# Patient Record
Sex: Female | Born: 1985 | Race: White | Hispanic: No | Marital: Married | Smoking: Current every day smoker
Health system: Southern US, Community
[De-identification: ages and names within clinical notes are randomized; demographics above are authoritative.]

## PROBLEM LIST (undated history)

## (undated) DIAGNOSIS — E282 Polycystic ovarian syndrome: Secondary | ICD-10-CM

## (undated) DIAGNOSIS — F419 Anxiety disorder, unspecified: Secondary | ICD-10-CM

## (undated) DIAGNOSIS — K589 Irritable bowel syndrome without diarrhea: Secondary | ICD-10-CM

## (undated) DIAGNOSIS — E119 Type 2 diabetes mellitus without complications: Secondary | ICD-10-CM

## (undated) DIAGNOSIS — K219 Gastro-esophageal reflux disease without esophagitis: Secondary | ICD-10-CM

## (undated) DIAGNOSIS — O24419 Gestational diabetes mellitus in pregnancy, unspecified control: Secondary | ICD-10-CM

## (undated) DIAGNOSIS — N912 Amenorrhea, unspecified: Secondary | ICD-10-CM

## (undated) DIAGNOSIS — E785 Hyperlipidemia, unspecified: Secondary | ICD-10-CM

## (undated) DIAGNOSIS — E079 Disorder of thyroid, unspecified: Secondary | ICD-10-CM

## (undated) DIAGNOSIS — K5909 Other constipation: Secondary | ICD-10-CM

## (undated) HISTORY — DX: Gestational diabetes mellitus in pregnancy, unspecified control: O24.419

## (undated) HISTORY — DX: Anxiety disorder, unspecified: F41.9

## (undated) HISTORY — DX: Type 2 diabetes mellitus without complications: E11.9

## (undated) HISTORY — DX: Polycystic ovarian syndrome: E28.2

## (undated) HISTORY — PX: OTHER SURGICAL HISTORY: SHX169

## (undated) HISTORY — DX: Other constipation: K59.09

## (undated) HISTORY — DX: Amenorrhea, unspecified: N91.2

## (undated) HISTORY — PX: CORONARY ANGIOPLASTY WITH STENT PLACEMENT: SHX49

## (undated) HISTORY — DX: Hyperlipidemia, unspecified: E78.5

## (undated) HISTORY — DX: Irritable bowel syndrome, unspecified: K58.9

---

## 2007-07-14 ENCOUNTER — Ambulatory Visit: Payer: Self-pay | Admitting: Unknown Physician Specialty

## 2013-11-16 DIAGNOSIS — K5904 Chronic idiopathic constipation: Secondary | ICD-10-CM | POA: Insufficient documentation

## 2013-11-16 DIAGNOSIS — K589 Irritable bowel syndrome without diarrhea: Secondary | ICD-10-CM | POA: Insufficient documentation

## 2015-01-20 ENCOUNTER — Encounter: Payer: Self-pay | Admitting: Gynecology

## 2015-01-20 ENCOUNTER — Ambulatory Visit
Admission: EM | Admit: 2015-01-20 | Discharge: 2015-01-20 | Disposition: A | Discharge status: Home / Self Care | Payer: BLUE CROSS/BLUE SHIELD | Attending: Family Medicine | Admitting: Family Medicine

## 2015-01-20 DIAGNOSIS — J029 Acute pharyngitis, unspecified: Secondary | ICD-10-CM | POA: Insufficient documentation

## 2015-01-20 HISTORY — DX: Disorder of thyroid, unspecified: E07.9

## 2015-01-20 HISTORY — DX: Gastro-esophageal reflux disease without esophagitis: K21.9

## 2015-01-20 LAB — RAPID STREP SCREEN (MED CTR MEBANE ONLY): STREPTOCOCCUS, GROUP A SCREEN (DIRECT): NEGATIVE

## 2015-01-20 MED ORDER — AMOXICILLIN-POT CLAVULANATE 875-125 MG PO TABS: 1.0000 | ORAL_TABLET | Freq: Two times a day (BID) | ORAL | Status: AC

## 2015-01-20 NOTE — ED Provider Notes (Signed)
SUBJECTIVE:  Holly Villa is a 29 y.o. female who complains of sore throat, nasal drainage, right ear pain, fever for the last 3 days. Denies CP, SOB, N/V/D, abdominal pain, severe headache. Has tried OTC Medication without much relief. Admits to hx of mono in the past.   ASN:[id number] except mentioned above.    OBJECTIVE: She appears well, vital signs are as noted.  General: NAD HEENT: moderate pharyngeal erythema with tonsillar enlargement, mild exudate, no abscess appreciated, no erythema of TMs, mild cervical LAD bilaterally Respiratory: CTA B Cardiology: RRR Abdomen: +BS, NT/ND, no guarding or rebound Neurological: CN II-XII grossly intact   ASSESSMENT:  Pharyngitis  PLAN: Rapid Strep negative. Throat cx sent. Augmentin, Claritin prn, Delysm prn, Tylenol/Motrin prn, rest hydration, seek medical attention if symptoms persist or worsen.        Jolene Provost, MD 01/20/15 1021

## 2015-01-20 NOTE — ED Notes (Signed)
Patient c/o sore throat / ear pain. Pt. Stated painful to swallow and ears hurt when swallowing x 3 days. Pt. Stated fever at home of 102.

## 2015-01-22 ENCOUNTER — Telehealth: Payer: Self-pay | Admitting: Emergency Medicine

## 2015-01-22 LAB — CULTURE, GROUP A STREP (THRC)

## 2015-01-22 NOTE — ED Notes (Signed)
Patient was notified that her Strep test was positive and to continue with her Augmentin.  Patient was also instructed to follow-up here or with her PCP if her symptoms no not improve once she has completed her antibiotic.  Patient verbalized understanding.

## 2016-08-31 NOTE — L&D Delivery Note (Signed)
       Delivery Note   Sameera Sprunk is a 31 y.o. G1P0 at [redacted]w[redacted]d Estimated Date of Delivery: 04/26/17  PRE-OPERATIVE DIAGNOSIS:  1) [redacted]w[redacted]d pregnancy.  2) Labor 3) Non-reassuring BPP 4) hydramnios  POST-OPERATIVE DIAGNOSIS:  1) [redacted]w[redacted]d pregnancy s/p Vaginal, Spontaneous Delivery   Delivery Type: Vaginal, Spontaneous Delivery    Delivery Clinician: Linzie Collin   Delivery Anesthesia: Local   Labor Complications:     Additional complications:   ESTIMATED BLOOD LOSS:    FINDINGS:   1) female infant, Apgar scores of 7    at 1 minute and 8    at 5 minutes and a birthweight of 107.94  ounces.    2) Nuchal cord: No  SPECIMENS:   PLACENTA:   Appearance: Intact    Removal: Spontaneous      Disposition:     DISPOSITION:  Infant to left in stable condition in the delivery room, with L&D personnel and mother,  NARRATIVE SUMMARY: Labor course:  Ms. Diego Chahal is a G1P0 at [redacted]w[redacted]d who presented for labor management.  She progressed well in labor without pitocin.  She received the appropriate anesthesia and proceeded to complete dilation. She evidenced good maternal expulsive effort during the second stage. She went on to deliver a viable infant. The placenta delivered without problems and was noted to be complete. A perineal and vaginal examination was performed. Episiotomy/Lacerations: - Bilat superficial labial lacs Episiotomy or lacerations were repaired with Vicryl suture using local anesthesia. The patient tolerated this well.  Elonda Husky, M.D. 03/26/2017 2:15 PM

## 2016-10-05 ENCOUNTER — Ambulatory Visit (INDEPENDENT_AMBULATORY_CARE_PROVIDER_SITE_OTHER): Payer: BLUE CROSS/BLUE SHIELD | Admitting: Certified Nurse Midwife

## 2016-10-05 VITALS — BP 122/92 | HR 95 | Ht 62.5 in | Wt 149.3 lb

## 2016-10-05 DIAGNOSIS — Z1389 Encounter for screening for other disorder: Secondary | ICD-10-CM

## 2016-10-05 DIAGNOSIS — Z113 Encounter for screening for infections with a predominantly sexual mode of transmission: Secondary | ICD-10-CM

## 2016-10-05 DIAGNOSIS — N926 Irregular menstruation, unspecified: Secondary | ICD-10-CM

## 2016-10-05 DIAGNOSIS — E079 Disorder of thyroid, unspecified: Secondary | ICD-10-CM

## 2016-10-05 DIAGNOSIS — Z3687 Encounter for antenatal screening for uncertain dates: Secondary | ICD-10-CM

## 2016-10-05 DIAGNOSIS — Z3401 Encounter for supervision of normal first pregnancy, first trimester: Secondary | ICD-10-CM

## 2016-10-05 DIAGNOSIS — T7589XA Other specified effects of external causes, initial encounter: Secondary | ICD-10-CM

## 2016-10-05 DIAGNOSIS — E282 Polycystic ovarian syndrome: Secondary | ICD-10-CM

## 2016-10-05 NOTE — Patient Instructions (Signed)
Pregnancy and Zika Virus Disease Introduction Zika virus disease, or Zika, is an illness that can spread to people from mosquitoes that carry the virus. It may also spread from person to person through infected body fluids. Zika first occurred in [place], but recently it has spread to new areas. The virus occurs in tropical climates. The location of Zika continues to change. Most people who become infected with Zika virus do not develop serious illness. However, Zika may cause birth defects in an unborn baby whose mother is infected with the virus. It may also increase the risk of miscarriage. What are the symptoms of Zika virus disease? In many cases, people who have been infected with Zika virus do not develop any symptoms. If symptoms appear, they usually start about a week after the person is infected. Symptoms are usually mild. They may include:  Fever.  Rash.  Red eyes.  Joint pain. How does Zika virus disease spread? The main way that Zika virus spreads is through the bite of a certain type of mosquito. Unlike most types of mosquitos, which bite only at night, the type of mosquito that carries Zika virus bites both at night and during the day. Zika virus can also spread through sexual contact, through a blood transfusion, and from a mother to her baby before or during birth. Once you have had Zika virus disease, it is unlikely that you will get it again. Can I pass Zika to my baby during pregnancy? Yes, Zika can pass from a mother to her baby before or during birth. What problems can Zika cause for my baby? A woman who is infected with Zika virus while pregnant is at risk of having her baby born with a condition in which the brain or head is smaller than expected (microcephaly). Babies who have microcephaly can have developmental delays, seizures, hearing problems, and vision problems. Having Zika virus disease during pregnancy can also increase the risk of miscarriage. How can Zika  virus disease be prevented? There is no vaccine to prevent Zika. The best way to prevent the disease is to avoid infected mosquitoes and avoid exposure to body fluids that can spread the virus. Avoid any possible exposure to Zika by taking the following precautions. For women and their sex partners:  Avoid traveling to high-risk areas. The locations where Zika is being reported change often. To identify high-risk areas, check the CDC travel website: www.cdc.gov/zika/geo/index.html  If you or your sex partner must travel to a high-risk area, talk with a health care provider before and after traveling.  Take all precautions to avoid mosquito bites if you live in, or travel to, any of the high-risk areas. Insect repellents are safe to use during pregnancy.  Ask your health care provider when it is safe to have sexual contact. For women:  If you are pregnant or trying to become pregnant, avoid sexual contact with persons who may have been exposed to Zika virus, persons who have possible symptoms of Zika, or persons whose history you are unsure about. If you choose to have sexual contact with someone who may have been exposed to Zika virus, use condoms correctly during the entire duration of sexual activity, every time. Do not share sexual devices, as you may be exposed to body fluids.  Ask your health care provider about when it is safe to attempt pregnancy after a possible exposure to Zika virus. What steps should I take to avoid mosquito bites? Take these steps to avoid mosquito bites when you   are in a high-risk area:  Wear loose clothing that covers your arms and legs.  Limit your outdoor activities.  Do not open windows unless they have window screens.  Sleep under mosquito nets.  Use insect repellent. The best insect repellents have:  DEET, picaridin, oil of lemon eucalyptus (OLE), or [id number] in them.  Higher amounts of an active ingredient in them.  Remember that insect repellents  are safe to use during pregnancy.  Do not use OLE on children who are younger than 3 years of age. Do not use insect repellent on babies who are younger than 2 months of age.  Cover your child's stroller with mosquito netting. Make sure the netting fits snugly and that any loose netting does not cover your child's mouth or nose. Do not use a blanket as a mosquito-protection cover.  Do not apply insect repellent underneath clothing.  If you are using sunscreen, apply the sunscreen before applying the insect repellent.  Treat clothing with permethrin. Do not apply permethrin directly to your skin. Follow label directions for safe use.  Get rid of standing water, where mosquitoes may reproduce. Standing water is often found in items such as buckets, bowls, animal food dishes, and flowerpots. When you return from traveling to any high-risk area, continue taking actions to protect yourself against mosquito bites for 3 weeks, even if you show no signs of illness. This will prevent spreading Zika virus to uninfected mosquitoes. What should I know about the sexual transmission of Zika? People can spread Zika to their sexual partners during vaginal, anal, or oral sex, or by sharing sexual devices. Many people with Zika do not develop symptoms, so a person could spread the disease without knowing that they are infected. The greatest risk is to women who are pregnant or who may become pregnant. Zika virus can live longer in semen than it can live in blood. Couples can prevent sexual transmission of the virus by:  Using condoms correctly during the entire duration of sexual activity, every time. This includes vaginal, anal, and oral sex.  Not sharing sexual devices. Sharing increases your risk of being exposed to body fluid from another person.  Avoiding all sexual activity until your health care provider says it is safe. Should I be tested for Zika virus? A sample of your blood can be tested for Zika  virus. A pregnant woman should be tested if she may have been exposed to the virus or if she has symptoms of Zika. She may also have additional tests done during her pregnancy, such ultrasound testing. Talk with your health care provider about which tests are recommended. This information is not intended to replace advice given to you by your health care provider. Make sure you discuss any questions you have with your health care provider. Document Released: 05/08/2015 Document Revised: 01/23/2016 Document Reviewed: 05/01/2015  2017 Elsevier Pregnancy and Toxoplasmosis Toxoplasmosis is an infection that is caused by a parasite. Usually, there are no symptoms and the body can fight off the infection. If you get toxoplasmosis during pregnancy, there is a chance that the infection will spread to your baby. If this happens, your baby may develop serious health problems, such as blindness, intellectual disabilities, and other neurological disorders. Some of these problems may not show up for years. How do people get toxoplasmosis? You can get toxoplasmosis if:  You touch anything that is contaminated with infected cat feces and then you touch your mouth.  You eat raw or undercooked meat from   an infected animal.  You eat fruits and vegetables that were grown in contaminated soil. Your baby can get toxoplasmosis through your blood supply if you are infected during pregnancy or just before pregnancy. How can I protect myself and my baby against toxoplasmosis?  Do not get a new cat.  Do not touch stray cats.  If you have a sandbox, cover it when it is not being used.  Avoid working in soil where cats may leave feces.  Wear gloves when you work in the soil. Wash your hands with soap and water when you are finished.  If you have a cat:  Have someone else change the cat's litter box daily. He or she should wash his or her hands afterward.  Do not let your cat outside.  Do not feed your cat any  raw meat.  Do not eat undercooked meat, especially meat that has never been frozen.  Wash and peel all fruits and vegetables before eating them.  Avoid drinking untreated water.  If you have toxoplasmosis and you are not pregnant, wait at least 6 months before becoming pregnant. How do I know if I have toxoplasmosis? The only way to know for sure that you have toxoplasmosis is with a test. People with toxoplasmosis do not always have symptoms. If symptoms are present, they may include:  A fever.  Swollen glands.  Muscle aches.  Headaches.  Feeling like you have a cold or the flu. If you think you have toxoplasmosis, or if you think that you may have been exposed to it, call your health care provider. How is toxoplasmosis diagnosed? When you become pregnant, your health care provider may order a blood test to check whether you have ever had toxoplasmosis.  If you have had toxoplasmosis infection before, you cannot get it again.  If you have never had toxoplasmosis, your health care provider may repeat this test at a later date.  If you become infected during pregnancy, your health care provider may do more tests to find out whether the infection has spread to the baby.  Other tests may include an ultrasound and a test of your amniotic fluid (amniocentesis). How is toxoplasmosis treated? Toxoplasmosis may be treated with antibiotics and other medicines.  Some of these medicines can lower your baby's chance of developing complications later on.  Medicines may need to be taken for up to one year. What should I do at home if I am diagnosed with toxoplasmosis?  Take over-the-counter and prescription medicines only as told by your health care provider.  If you were prescribed an antibiotic medicine, take it as told by your health care provider. Do not stop taking the antibiotic even if you start to feel better.  Keep all follow-up visits as told by your health care provider. This  is important. This information is not intended to replace advice given to you by your health care provider. Make sure you discuss any questions you have with your health care provider. Document Released: 11/23/2000 Document Revised: 04/16/2016 Document Reviewed: 03/22/2016 Elsevier Interactive Patient Education  2017 [employer] Minor Illnesses and Medications in Pregnancy  Cold/Flu:  Sudafed for congestion- Robitussin (plain) for cough- Tylenol for discomfort.  Please follow the directions on the label.  Try not to take any more than needed.  OTC Saline nasal spray and air humidifier or cool-mist  Vaporizer to sooth nasal irritation and to loosen congestion.  It is also important to increase intake of non carbonated fluids, especially if you   have a fever.  Constipation:  Colace-2 capsules at bedtime; Metamucil- follow directions on label; Senokot- 1 tablet at bedtime.  Any one of these medications can be used.  It is also very important to increase fluids and fruits along with regular exercise.  If problem persists please call the office.  Diarrhea:  Kaopectate as directed on the label.  Eat a bland diet and increase fluids.  Avoid highly seasoned foods.  Headache:  Tylenol 1 or 2 tablets every 3-4 hours as needed  Indigestion:  Maalox, Mylanta, Tums or Rolaids- as directed on label.  Also try to eat small meals and avoid fatty, greasy or spicy foods.  Nausea with or without Vomiting:  Nausea in pregnancy is caused by increased levels of hormones in the body which influence the digestive system and cause irritation when stomach acids accumulate.  Symptoms usually subside after 1st trimester of pregnancy.  Try the following: 1. Keep saltines, graham crackers or dry toast by your bed to eat upon awakening. 2. Don't let your stomach get empty.  Try to eat 5-6 small meals per day instead of 3 large ones. 3. Avoid greasy fatty or highly seasoned foods.  4. Take OTC Unisom 1 tablet at bed time  along with OTC Vitamin B6 25-50 mg 3 times per day.    If nausea continues with vomiting and you are unable to keep down food and fluids you may need a prescription medication.  Please notify your provider.   Sore throat:  Chloraseptic spray, throat lozenges and or plain Tylenol.  Vaginal Yeast Infection:  OTC Monistat for 7 days as directed on label.  If symptoms do not resolve within a week notify provider.  If any of the above problems do not subside with recommended treatment please call the office for further assistance.   Do not take Aspirin, Advil, Motrin or Ibuprofen.  * * OTC= Over the counter Hyperemesis Gravidarum Hyperemesis gravidarum is a severe form of nausea and vomiting that happens during pregnancy. Hyperemesis is worse than morning sickness. It may cause you to have nausea or vomiting all day for many days. It may keep you from eating and drinking enough food and liquids. Hyperemesis usually occurs during the first half (the first 20 weeks) of pregnancy. It often goes away once a woman is in her second half of pregnancy. However, sometimes hyperemesis continues through an entire pregnancy. What are the causes? The cause of this condition is not known. It may be related to changes in chemicals (hormones) in the body during pregnancy, such as the high level of pregnancy hormone (human chorionic gonadotropin) or the increase in the female sex hormone (estrogen). What are the signs or symptoms? Symptoms of this condition include:  Severe nausea and vomiting.  Nausea that does not go away.  Vomiting that does not allow you to keep any food down.  Weight loss.  Body fluid loss (dehydration).  Having no desire to eat, or not liking food that you have previously enjoyed. How is this diagnosed? This condition may be diagnosed based on:  A physical exam.  Your medical history.  Your symptoms.  Blood tests.  Urine tests. How is this treated? This condition may be  managed with medicine. If medicines to do not help relieve nausea and vomiting, you may need to receive fluids through an IV tube at the hospital. Follow these instructions at home:  Take over-the-counter and prescription medicines only as told by your health care provider.  Avoid iron   pills and multivitamins that contain iron for the first 3-4 months of pregnancy. If you take prescription iron pills, do not stop taking them unless your health care provider approves.  Take the following actions to help prevent nausea and vomiting:  In the morning, before getting out of bed, try eating a couple of dry crackers or a piece of toast.  Avoid foods and smells that upset your stomach. Fatty and spicy foods may make nausea worse.  Eat 5-6 small meals a day.  Do not drink fluids while eating meals. Drink between meals.  Eat or suck on things that have ginger in them. Ginger can help relieve nausea.  Avoid food preparation. The smell of food can spoil your appetite or trigger nausea.  Follow instructions from your health care provider about eating or drinking restrictions.  For snacks, eat high-protein foods, such as cheese.  Keep all follow-up and pre-birth (prenatal) visits as told by your health care provider. This is important. Contact a health care provider if:  You have pain in your abdomen.  You have a severe headache.  You have vision problems.  You are losing weight. Get help right away if:  You cannot drink fluids without vomiting.  You vomit blood.  You have constant nausea and vomiting.  You are very weak.  You are very thirsty.  You feel dizzy.  You faint.  You have a fever or other symptoms that last for more than 2-3 days.  You have a fever and your symptoms suddenly get worse. Summary  Hyperemesis gravidarum is a severe form of nausea and vomiting that happens during pregnancy.  Making some changes to your eating habits may help relieve nausea and  vomiting.  This condition may be managed with medicine.  If medicines to do not help relieve nausea and vomiting, you may need to receive fluids through an IV tube at the hospital. This information is not intended to replace advice given to you by your health care provider. Make sure you discuss any questions you have with your health care provider. Document Released: 08/17/2005 Document Revised: 04/15/2016 Document Reviewed: 04/15/2016 Elsevier Interactive Patient Education  2017 [employer] First Trimester of Pregnancy The first trimester of pregnancy is from week 1 until the end of week 12 (months 1 through 3). During this time, your baby will begin to develop inside you. At 6-8 weeks, the eyes and face are formed, and the heartbeat can be seen on ultrasound. At the end of 12 weeks, all the baby's organs are formed. Prenatal care is all the medical care you receive before the birth of your baby. Make sure you get good prenatal care and follow all of your doctor's instructions. Follow these instructions at home: Medicines  Take medicine only as told by your doctor. Some medicines are safe and some are not during pregnancy.  Take your prenatal vitamins as told by your doctor.  Take medicine that helps you poop (stool softener) as needed if your doctor says it is okay. Diet  Eat regular, healthy meals.  Your doctor will tell you the amount of weight gain that is right for you.  Avoid raw meat and uncooked cheese.  If you feel sick to your stomach (nauseous) or throw up (vomit):  Eat 4 or 5 small meals a day instead of 3 large meals.  Try eating a few soda crackers.  Drink liquids between meals instead of during meals.  If you have a hard time pooping (constipation):  Eat   high-fiber foods like fresh vegetables, fruit, and whole grains.  Drink enough fluids to keep your pee (urine) clear or pale yellow. Activity and Exercise  Exercise only as told by your doctor. Stop  exercising if you have cramps or pain in your lower belly (abdomen) or low back.  Try to avoid standing for long periods of time. Move your legs often if you must stand in one place for a long time.  Avoid heavy lifting.  Wear low-heeled shoes. Sit and stand up straight.  You can have sex unless your doctor tells you not to. Relief of Pain or Discomfort  Wear a good support bra if your breasts are sore.  Take warm water baths (sitz baths) to soothe pain or discomfort caused by hemorrhoids. Use hemorrhoid cream if your doctor says it is okay.  Rest with your legs raised if you have leg cramps or low back pain.  Wear support hose if you have puffy, bulging veins (varicose veins) in your legs. Raise (elevate) your feet for 15 minutes, 3-4 times a day. Limit salt in your diet. Prenatal Care  Schedule your prenatal visits by the twelfth week of pregnancy.  Write down your questions. Take them to your prenatal visits.  Keep all your prenatal visits as told by your doctor. Safety  Wear your seat belt at all times when driving.  Make a list of emergency phone numbers. The list should include numbers for family, friends, the hospital, and police and fire departments. General Tips  Ask your doctor for a referral to a local prenatal class. Begin classes no later than at the start of month 6 of your pregnancy.  Ask for help if you need counseling or help with nutrition. Your doctor can give you advice or tell you where to go for help.  Do not use hot tubs, steam rooms, or saunas.  Do not douche or use tampons or scented sanitary pads.  Do not cross your legs for long periods of time.  Avoid litter boxes and soil used by cats.  Avoid all smoking, herbs, and alcohol. Avoid drugs not approved by your doctor.  Do not use any tobacco products, including cigarettes, chewing tobacco, and electronic cigarettes. If you need help quitting, ask your doctor. You may get counseling or other  support to help you quit.  Visit your dentist. At home, brush your teeth with a soft toothbrush. Be gentle when you floss. Get help if:  You are dizzy.  You have mild cramps or pressure in your lower belly.  You have a nagging pain in your belly area.  You continue to feel sick to your stomach, throw up, or have watery poop (diarrhea).  You have a bad smelling fluid coming from your vagina.  You have pain with peeing (urination).  You have increased puffiness (swelling) in your face, hands, legs, or ankles. Get help right away if:  You have a fever.  You are leaking fluid from your vagina.  You have spotting or bleeding from your vagina.  You have very bad belly cramping or pain.  You gain or lose weight rapidly.  You throw up blood. It may look like coffee grounds.  You are around people who have German measles, fifth disease, or chickenpox.  You have a very bad headache.  You have shortness of breath.  You have any kind of trauma, such as from a fall or a car accident. This information is not intended to replace advice given to you by   your health care provider. Make sure you discuss any questions you have with your health care provider. Document Released: 02/03/2008 Document Revised: 01/23/2016 Document Reviewed: 06/27/2013 Elsevier Interactive Patient Education  2017 [employer] Commonly Asked Questions During Pregnancy  Cats: A parasite can be excreted in cat feces.  To avoid exposure you need to have another person empty the little box.  If you must empty the litter box you will need to wear gloves.  Wash your hands after handling your cat.  This parasite can also be found in raw or undercooked meat so this should also be avoided.  Colds, Sore Throats, Flu: Please check your medication sheet to see what you can take for symptoms.  If your symptoms are unrelieved by these medications please call the office.  Dental Work: Most any dental work your dentist  recommends is permitted.  X-rays should only be taken during the first trimester if absolutely necessary.  Your abdomen should be shielded with a lead apron during all x-rays.  Please notify your provider prior to receiving any x-rays.  Novocaine is fine; gas is not recommended.  If your dentist requires a note from us prior to dental work please call the office and we will provide one for you.  Exercise: Exercise is an important part of staying healthy during your pregnancy.  You may continue most exercises you were accustomed to prior to pregnancy.  Later in your pregnancy you will most likely notice you have difficulty with activities requiring balance like riding a bicycle.  It is important that you listen to your body and avoid activities that put you at a higher risk of falling.  Adequate rest and staying well hydrated are a must!  If you have questions about the safety of specific activities ask your provider.    Exposure to Children with illness: Try to avoid obvious exposure; report any symptoms to us when noted,  If you have chicken pos, red measles or mumps, you should be immune to these diseases.   Please do not take any vaccines while pregnant unless you have checked with your OB provider.  Fetal Movement: After 28 weeks we recommend you do "kick counts" twice daily.  Lie or sit down in a calm quiet environment and count your baby movements "kicks".  You should feel your baby at least 10 times per hour.  If you have not felt 10 kicks within the first hour get up, walk around and have something sweet to eat or drink then repeat for an additional hour.  If count remains less than 10 per hour notify your provider.  Fumigating: Follow your pest control agent's advice as to how long to stay out of your home.  Ventilate the area well before re-entering.  Hemorrhoids:   Most over-the-counter preparations can be used during pregnancy.  Check your medication to see what is safe to use.  It is important  to use a stool softener or fiber in your diet and to drink lots of liquids.  If hemorrhoids seem to be getting worse please call the office.   Hot Tubs:  Hot tubs Jacuzzis and saunas are not recommended while pregnant.  These increase your internal body temperature and should be avoided.  Intercourse:  Sexual intercourse is safe during pregnancy as long as you are comfortable, unless otherwise advised by your provider.  Spotting may occur after intercourse; report any bright red bleeding that is heavier than spotting.  Labor:  If you know that you are   in labor, please go to the hospital.  If you are unsure, please call the office and let us help you decide what to do.  Lifting, straining, etc:  If your job requires heavy lifting or straining please check with your provider for any limitations.  Generally, you should not lift items heavier than that you can lift simply with your hands and arms (no back muscles)  Painting:  Paint fumes do not harm your pregnancy, but may make you ill and should be avoided if possible.  Latex or water based paints have less odor than oils.  Use adequate ventilation while painting.  Permanents & Hair Color:  Chemicals in hair dyes are not recommended as they cause increase hair dryness which can increase hair loss during pregnancy.  " Highlighting" and permanents are allowed.  Dye may be absorbed differently and permanents may not hold as well during pregnancy.  Sunbathing:  Use a sunscreen, as skin burns easily during pregnancy.  Drink plenty of fluids; avoid over heating.  Tanning Beds:  Because their possible side effects are still unknown, tanning beds are not recommended.  Ultrasound Scans:  Routine ultrasounds are performed at approximately 20 weeks.  You will be able to see your baby's general anatomy an if you would like to know the gender this can usually be determined as well.  If it is questionable when you conceived you may also receive an ultrasound early  in your pregnancy for dating purposes.  Otherwise ultrasound exams are not routinely performed unless there is a medical necessity.  Although you can request a scan we ask that you pay for it when conducted because insurance does not cover " patient request" scans.  Work: If your pregnancy proceeds without complications you may work until your due date, unless your physician or employer advises otherwise.  Round Ligament Pain/Pelvic Discomfort:  Sharp, shooting pains not associated with bleeding are fairly common, usually occurring in the second trimester of pregnancy.  They tend to be worse when standing up or when you remain standing for long periods of time.  These are the result of pressure of certain pelvic ligaments called "round ligaments".  Rest, Tylenol and heat seem to be the most effective relief.  As the womb and fetus grow, they rise out of the pelvis and the discomfort improves.  Please notify the office if your pain seems different than that described.  It may represent a more serious condition.   

## 2016-10-05 NOTE — Progress Notes (Signed)
Holly Villa presents for NOB nurse interview visit. Pregnancy confirmation done by [facility]. Pt brought her MyChart results which was a BHCG: 96,472. Will repeat BHCG.  She also states that UPT: positive also. No paper work for that. Pt did not know her LMP. She may have 6 months of no menses at times. Pt states she was just told she had PCOS and was to get an IUD.  Her best guess was 05/31/2016. This makes her EGA 18.1 wks. Pt does not appear to be showing at this time. Pt will have ultrasound to determine viability and dating asap.   G-1. P-0000. Pregnancy education material explained and given. Has cat in the home. She has changed the litter box most likely before she knew she was pregnant so a Toxoplasmosis was done. NOB labs ordered.  HIV labs and Drug screen were explained optional and she did not decline. Drug screen ordered. PNV encouraged. Genetic screening declined, to discuss with provider. Pt. To follow up with provider after ultrasound for dating and viability, for NOB physical. Pt also states she is not taking her synthroid because it did not make her feel any better and her labs were normal. On 07/09/2016 pt had TSH: 3.35 and T4: 0.79 at [facility]. Will check today other blood work.  All questions answered.

## 2016-10-06 ENCOUNTER — Ambulatory Visit (INDEPENDENT_AMBULATORY_CARE_PROVIDER_SITE_OTHER): Payer: BLUE CROSS/BLUE SHIELD

## 2016-10-06 DIAGNOSIS — N926 Irregular menstruation, unspecified: Secondary | ICD-10-CM | POA: Diagnosis not present

## 2016-10-06 DIAGNOSIS — Z3401 Encounter for supervision of normal first pregnancy, first trimester: Secondary | ICD-10-CM | POA: Diagnosis not present

## 2016-10-06 DIAGNOSIS — Z3687 Encounter for antenatal screening for uncertain dates: Secondary | ICD-10-CM

## 2016-10-06 LAB — CBC WITH DIFFERENTIAL/PLATELET
Basophils Absolute: 0.1 10*3/uL (ref 0.0–0.2)
Basos: 1 %
EOS (ABSOLUTE): 0.3 10*3/uL (ref 0.0–0.4)
Eos: 3 %
Hematocrit: 41.2 % (ref 34.0–46.6)
Hemoglobin: 14.4 g/dL (ref 11.1–15.9)
Immature Grans (Abs): 0 10*3/uL (ref 0.0–0.1)
Immature Granulocytes: 0 %
LYMPHS ABS: 3.8 10*3/uL — AB (ref 0.7–3.1)
Lymphs: 29 %
MCH: 30.6 pg (ref 26.6–33.0)
MCHC: 35 g/dL (ref 31.5–35.7)
MCV: 88 fL (ref 79–97)
MONOS ABS: 0.7 10*3/uL (ref 0.1–0.9)
Monocytes: 6 %
NEUTROS PCT: 61 %
Neutrophils Absolute: 8 10*3/uL — ABNORMAL HIGH (ref 1.4–7.0)
Platelets: 453 10*3/uL — ABNORMAL HIGH (ref 150–379)
RBC: 4.7 x10E6/uL (ref 3.77–5.28)
RDW: 12.9 % (ref 12.3–15.4)
WBC: 12.9 10*3/uL — ABNORMAL HIGH (ref 3.4–10.8)

## 2016-10-06 LAB — TOXOPLASMA ANTIBODIES- IGG AND  IGM
Toxoplasma Antibody- IgM: <3 AU/mL (ref 0.0–7.9)
Toxoplasma IgG Ratio: <3 IU/mL (ref 0.0–7.1)

## 2016-10-06 LAB — ANTIBODY SCREEN: ANTIBODY SCREEN: NEGATIVE

## 2016-10-06 LAB — THYROID PANEL WITH TSH
FREE THYROXINE INDEX: 1.9 (ref 1.2–4.9)
T3 Uptake Ratio: 19 % — ABNORMAL LOW (ref 24–39)
T4, Total: 9.9 ug/dL (ref 4.5–12.0)
TSH: 3.44 u[IU]/mL (ref 0.450–4.500)

## 2016-10-06 LAB — URINALYSIS, ROUTINE W REFLEX MICROSCOPIC
BILIRUBIN UA: NEGATIVE
Glucose, UA: NEGATIVE
Ketones, UA: NEGATIVE
Nitrite, UA: NEGATIVE
Protein, UA: NEGATIVE
RBC UA: NEGATIVE
Specific Gravity, UA: 1.012 (ref 1.005–1.030)
UUROB: 0.2 mg/dL (ref 0.2–1.0)
pH, UA: 6.5 (ref 5.0–7.5)

## 2016-10-06 LAB — MONITOR DRUG PROFILE 14(MW)
Amphetamine Scrn, Ur: NEGATIVE ng/mL
BARBITURATE SCREEN URINE: NEGATIVE ng/mL
BENZODIAZEPINE SCREEN, URINE: NEGATIVE ng/mL
Buprenorphine, Urine: NEGATIVE ng/mL
CANNABINOIDS UR QL SCN: NEGATIVE ng/mL
CREATININE(CRT), U: 56.2 mg/dL (ref 20.0–300.0)
Cocaine (Metab) Scrn, Ur: NEGATIVE ng/mL
Fentanyl, Urine: NEGATIVE pg/mL
Meperidine Screen, Urine: NEGATIVE ng/mL
Methadone Screen, Urine: NEGATIVE ng/mL
OXYCODONE+OXYMORPHONE UR QL SCN: NEGATIVE ng/mL
Opiate Scrn, Ur: NEGATIVE ng/mL
Ph of Urine: 6.2 (ref 4.5–8.9)
Phencyclidine Qn, Ur: NEGATIVE ng/mL
Propoxyphene Scrn, Ur: NEGATIVE ng/mL
SPECIFIC GRAVITY: 1.011
Tramadol Screen, Urine: NEGATIVE ng/mL

## 2016-10-06 LAB — MICROSCOPIC EXAMINATION
CASTS: NONE SEEN /LPF
Epithelial Cells (non renal): >10 /hpf — AB (ref 0–10)

## 2016-10-06 LAB — RPR: RPR Ser Ql: NONREACTIVE

## 2016-10-06 LAB — RUBELLA SCREEN: RUBELLA: 2.55 {index} (ref >0.99)

## 2016-10-06 LAB — HIV ANTIBODY (ROUTINE TESTING W REFLEX): HIV SCREEN 4TH GENERATION: NONREACTIVE

## 2016-10-06 LAB — NICOTINE SCREEN, URINE: Cotinine Ql Scrn, Ur: POSITIVE ng/mL

## 2016-10-06 LAB — VARICELLA ZOSTER ANTIBODY, IGG: VARICELLA: 265 {index} (ref >165)

## 2016-10-06 LAB — RH TYPE: Rh Factor: POSITIVE

## 2016-10-06 LAB — HEPATITIS B SURFACE ANTIGEN: Hepatitis B Surface Ag: NEGATIVE

## 2016-10-06 LAB — ABO

## 2016-10-06 NOTE — Progress Notes (Signed)
Please contact the patient and make sure that she is taking her synthroid as prescribed. Let her know that we will recheck her level next month. Thanks, JML

## 2016-10-07 LAB — GC/CHLAMYDIA PROBE AMP
CHLAMYDIA, DNA PROBE: NEGATIVE
Neisseria gonorrhoeae by PCR: NEGATIVE

## 2016-10-07 LAB — URINE CULTURE, OB REFLEX

## 2016-10-07 LAB — CULTURE, OB URINE

## 2016-10-15 ENCOUNTER — Ambulatory Visit (INDEPENDENT_AMBULATORY_CARE_PROVIDER_SITE_OTHER): Payer: BLUE CROSS/BLUE SHIELD | Admitting: Certified Nurse Midwife

## 2016-10-15 ENCOUNTER — Other Ambulatory Visit: Payer: Self-pay | Admitting: Certified Nurse Midwife

## 2016-10-15 VITALS — BP 108/86 | HR 118 | Wt 151.2 lb

## 2016-10-15 DIAGNOSIS — Z1389 Encounter for screening for other disorder: Secondary | ICD-10-CM

## 2016-10-15 DIAGNOSIS — R8299 Other abnormal findings in urine: Secondary | ICD-10-CM

## 2016-10-15 DIAGNOSIS — O99280 Endocrine, nutritional and metabolic diseases complicating pregnancy, unspecified trimester: Secondary | ICD-10-CM

## 2016-10-15 DIAGNOSIS — Z8742 Personal history of other diseases of the female genital tract: Secondary | ICD-10-CM

## 2016-10-15 DIAGNOSIS — Z3401 Encounter for supervision of normal first pregnancy, first trimester: Secondary | ICD-10-CM

## 2016-10-15 DIAGNOSIS — R82998 Other abnormal findings in urine: Secondary | ICD-10-CM

## 2016-10-15 DIAGNOSIS — E039 Hypothyroidism, unspecified: Secondary | ICD-10-CM

## 2016-10-15 LAB — POCT URINALYSIS DIPSTICK
Bilirubin, UA: NEGATIVE
Blood, UA: NEGATIVE
Glucose, UA: NEGATIVE
Ketones, UA: NEGATIVE
Nitrite, UA: NEGATIVE
PH UA: 7.5
PROTEIN UA: NEGATIVE
Spec Grav, UA: 1.01
UROBILINOGEN UA: NEGATIVE

## 2016-10-15 NOTE — Patient Instructions (Signed)
Second Trimester of Pregnancy The second trimester is from week 13 through week 28, month 4 through 6. This is often the time in pregnancy that you feel your best. Often times, morning sickness has lessened or quit. You may have more energy, and you may get hungry more often. Your unborn baby (fetus) is growing rapidly. At the end of the sixth month, he or she is about 9 inches long and weighs about 1 pounds. You will likely feel the baby move (quickening) between 18 and 20 weeks of pregnancy. Follow these instructions at home:  Avoid all smoking, herbs, and alcohol. Avoid drugs not approved by your doctor.  Do not use any tobacco products, including cigarettes, chewing tobacco, and electronic cigarettes. If you need help quitting, ask your doctor. You may get counseling or other support to help you quit.  Only take medicine as told by your doctor. Some medicines are safe and some are not during pregnancy.  Exercise only as told by your doctor. Stop exercising if you start having cramps.  Eat regular, healthy meals.  Wear a good support bra if your breasts are tender.  Do not use hot tubs, steam rooms, or saunas.  Wear your seat belt when driving.  Avoid raw meat, uncooked cheese, and liter boxes and soil used by cats.  Take your prenatal vitamins.  Take 1500-2000 milligrams of calcium daily starting at the 20th week of pregnancy until you deliver your baby.  Try taking medicine that helps you poop (stool softener) as needed, and if your doctor approves. Eat more fiber by eating fresh fruit, vegetables, and whole grains. Drink enough fluids to keep your pee (urine) clear or pale yellow.  Take warm water baths (sitz baths) to soothe pain or discomfort caused by hemorrhoids. Use hemorrhoid cream if your doctor approves.  If you have puffy, bulging veins (varicose veins), wear support hose. Raise (elevate) your feet for 15 minutes, 3-4 times a day. Limit salt in your diet.  Avoid heavy  lifting, wear low heals, and sit up straight.  Rest with your legs raised if you have leg cramps or low back pain.  Visit your dentist if you have not gone during your pregnancy. Use a soft toothbrush to brush your teeth. Be gentle when you floss.  You can have sex (intercourse) unless your doctor tells you not to.  Go to your doctor visits. Get help if:  You feel dizzy.  You have mild cramps or pressure in your lower belly (abdomen).  You have a nagging pain in your belly area.  You continue to feel sick to your stomach (nauseous), throw up (vomit), or have watery poop (diarrhea).  You have bad smelling fluid coming from your vagina.  You have pain with peeing (urination). Get help right away if:  You have a fever.  You are leaking fluid from your vagina.  You have spotting or bleeding from your vagina.  You have severe belly cramping or pain.  You lose or gain weight rapidly.  You have trouble catching your breath and have chest pain.  You notice sudden or extreme puffiness (swelling) of your face, hands, ankles, feet, or legs.  You have not felt the baby move in over an hour.  You have severe headaches that do not go away with medicine.  You have vision changes. This information is not intended to replace advice given to you by your health care provider. Make sure you discuss any questions you have with your health care   provider. Document Released: 11/11/2009 Document Revised: 01/23/2016 Document Reviewed: 10/18/2012 Elsevier Interactive Patient Education  2017 Elsevier Inc.  

## 2016-10-15 NOTE — Progress Notes (Signed)
NEW OB HISTORY AND PHYSICAL  SUBJECTIVE:       Holly Villa is a 31 y.o. G1P0 female, Patient's last menstrual period was 05/31/2016 (approximate)., Estimated Date of Delivery: 04/26/17, [redacted]w[redacted]d, presents today for establishment of Prenatal Care.  She complains of constipation. Reports relief with use of OTC Miralax, magnesium, and colace.   Denies difficulty breathing or respiratory distress, chest pain, abdominal pain, vaginal bleeding, dysuria, and leg pain or swelling.   History significant for PCOS and hypothyroidism. Pt is smoking, but down to two (2) cigarettes a day.    Gynecologic History  Patient's last menstrual period was 05/31/2016 (approximate).  Obstetric History OB History  Gravida Para Term Preterm AB Living  1            SAB TAB Ectopic Multiple Live Births               # Outcome Date GA Lbr Len/2nd Weight Sex Delivery Anes PTL Lv  1 Current               Past Medical History:  Diagnosis Date  . Amenorrhea   . GERD (gastroesophageal reflux disease)   . PCOS (polycystic ovarian syndrome)    new diagnosis per pt.   . Thyroid disease     Past Surgical History:  Procedure Laterality Date  . none      Current Outpatient Prescriptions on File Prior to Visit  Medication Sig Dispense Refill  . Doxylamine-Pyridoxine 10-10 MG TBEC Take by mouth.    . levothyroxine (SYNTHROID, LEVOTHROID) 112 MCG tablet Take 112 mcg by mouth daily before breakfast.     No current facility-administered medications on file prior to visit.     No Known Allergies  Social History   Social History  . Marital status: Married    Spouse name: N/A  . Number of children: N/A  . Years of education: N/A   Occupational History  . Not on file.   Social History Main Topics  . Smoking status: Current Every Day Smoker    Packs/day: 5.00    Types: Cigarettes  . Smokeless tobacco: Never Used  . Alcohol use No     Comment: did before pregnant  . Drug use: No  . Sexual activity:  Yes    Partners: Male    Birth control/ protection: None   Other Topics Concern  . Not on file   Social History Narrative  . No narrative on file    Family History  Problem Relation Age of Onset  . Thyroid disease Mother   . Heart failure Father   . Stroke Maternal Grandmother   . Thyroid disease Maternal Grandmother   . Diabetes Maternal Grandfather     The following portions of the patient's history were reviewed and updated as appropriate: allergies, current medications, past OB history, past medical history, past surgical history, past family history, past social history, and problem list.    OBJECTIVE: Initial Physical Exam (New OB)  GENERAL APPEARANCE: alert, well appearing, in no apparent distress  HEAD: normocephalic, atraumatic  MOUTH: mucous membranes moist, pharynx normal without lesions and dental hygiene good  THYROID: no thyromegaly or masses present  BREASTS: no masses noted, no significant tenderness, no palpable axillary nodes, no skin changes  LUNGS: clear to auscultation, no wheezes, rales or rhonchi, symmetric air entry  HEART: regular rate and rhythm, no murmurs  ABDOMEN: soft, nontender, nondistended, no abnormal masses, no epigastric pain, fundus soft, nontender 12 weeks size and FHT present  EXTREMITIES: no redness or tenderness in the calves or thighs, no edema  SKIN: normal coloration and turgor, no rashes  LYMPH NODES: no adenopathy palpable  NEUROLOGIC: alert, oriented, normal speech, no focal findings or movement disorder noted  PELVIC EXAM EXTERNAL GENITALIA: normal appearing vulva with no masses, tenderness or lesions VAGINA: no abnormal discharge or lesions CERVIX: no lesions or cervical motion tenderness UTERUS: gravid and consistent with 12 weeks ADNEXA: no masses palpable and nontender  ASSESSMENT: Normal pregnancy Smoker PCOS  PLAN: Prenatal care Reviewed red flag symptoms and when to call RTC x 4 weeks for ROB,  early glucola, repeat Thyroid Panel See orders   Gunnar Bulla, CNM

## 2016-10-15 NOTE — Progress Notes (Signed)
Problem with constipation (has this history also). Had BM on Monday, none since. Was 2 wks prior without BM.  Also with pulse rate elevated pt had to sneeze.

## 2016-10-17 LAB — URINE CULTURE, OB REFLEX

## 2016-10-17 LAB — CULTURE, OB URINE

## 2016-10-20 NOTE — Progress Notes (Signed)
Could you contact [facility] about adding co-testing to this PAP. Thanks, JML

## 2016-10-26 ENCOUNTER — Telehealth: Payer: Self-pay | Admitting: Certified Nurse Midwife

## 2016-10-26 LAB — CYTOLOGY - PAP

## 2016-10-26 NOTE — Telephone Encounter (Signed)
HIPPA approved voicemail with name and contact information left on identified line.   Serafina Royals, CNM

## 2016-10-29 ENCOUNTER — Encounter: Payer: Self-pay | Admitting: Certified Nurse Midwife

## 2016-11-12 ENCOUNTER — Other Ambulatory Visit: Payer: BLUE CROSS/BLUE SHIELD

## 2016-11-12 ENCOUNTER — Other Ambulatory Visit: Payer: Self-pay | Admitting: Certified Nurse Midwife

## 2016-11-12 ENCOUNTER — Ambulatory Visit (INDEPENDENT_AMBULATORY_CARE_PROVIDER_SITE_OTHER): Payer: BLUE CROSS/BLUE SHIELD | Admitting: Certified Nurse Midwife

## 2016-11-12 DIAGNOSIS — Z3402 Encounter for supervision of normal first pregnancy, second trimester: Secondary | ICD-10-CM

## 2016-11-12 LAB — POCT URINALYSIS DIPSTICK
Bilirubin, UA: NEGATIVE
Clarity, UA: NEGATIVE
Glucose, UA: NEGATIVE
Ketones, UA: NEGATIVE
Nitrite, UA: NEGATIVE
PH UA: 7.5
PROTEIN UA: NEGATIVE
RBC UA: NEGATIVE
SPEC GRAV UA: 1.01
Urobilinogen, UA: 0.2

## 2016-11-12 MED ORDER — CITRANATAL HARMONY 27-1-260 MG PO CAPS: 1.0000 | ORAL_CAPSULE | Freq: Every day | ORAL | 6 refills | Status: DC

## 2016-11-12 NOTE — Patient Instructions (Signed)
Round Ligament Pain The round ligament is a cord of muscle and tissue that helps to support the uterus. It can become a source of pain during pregnancy if it becomes stretched or twisted as the baby grows. The pain usually begins in the second trimester of pregnancy, and it can come and go until the baby is delivered. It is not a serious problem, and it does not cause harm to the baby. Round ligament pain is usually a short, sharp, and pinching pain, but it can also be a dull, lingering, and aching pain. The pain is felt in the lower side of the abdomen or in the groin. It usually starts deep in the groin and moves up to the outside of the hip area. Pain can occur with:  A sudden change in position.  Rolling over in bed.  Coughing or sneezing.  Physical activity. Follow these instructions at home: Watch your condition for any changes. Take these steps to help with your pain:  When the pain starts, relax. Then try:  Sitting down.  Flexing your knees up to your abdomen.  Lying on your side with one pillow under your abdomen and another pillow between your legs.  Sitting in a warm bath for 15-20 minutes or until the pain goes away.  Take over-the-counter and prescription medicines only as told by your health care provider.  Move slowly when you sit and stand.  Avoid long walks if they cause pain.  Stop or lessen your physical activities if they cause pain. Contact a health care provider if:  Your pain does not go away with treatment.  You feel pain in your back that you did not have before.  Your medicine is not helping. Get help right away if:  You develop a fever or chills.  You develop uterine contractions.  You develop vaginal bleeding.  You develop nausea or vomiting.  You develop diarrhea.  You have pain when you urinate. This information is not intended to replace advice given to you by your health care provider. Make sure you discuss any questions you have with  your health care provider. Document Released: 05/26/2008 Document Revised: 01/23/2016 Document Reviewed: 10/24/2014 Elsevier Interactive Patient Education  2017 Elsevier Inc.  

## 2016-11-12 NOTE — Progress Notes (Signed)
ROB-Pt doing well. Early glucola and Thyroid panel today. Discussed round ligament pain and home treatment measures. Colposcopy scheduled 11/25/2016. Reviewed red flag symptoms and when to call. RTC x 4 weeks for ROB and anatomy scan.

## 2016-11-13 ENCOUNTER — Telehealth: Payer: Self-pay

## 2016-11-13 DIAGNOSIS — O24419 Gestational diabetes mellitus in pregnancy, unspecified control: Secondary | ICD-10-CM

## 2016-11-13 LAB — THYROID PANEL WITH TSH
Free Thyroxine Index: 1.4 (ref 1.2–4.9)
T3 UPTAKE RATIO: 12 % — AB (ref 24–39)
T4, Total: 11.3 ug/dL (ref 4.5–12.0)
TSH: 2.85 u[IU]/mL (ref 0.450–4.500)

## 2016-11-13 LAB — GLUCOSE TOLERANCE, 1 HOUR: Glucose, 1Hr PP: 218 mg/dL — ABNORMAL HIGH (ref 65–199)

## 2016-11-13 NOTE — Telephone Encounter (Signed)
-----   Message from Gunnar Bulla, CNM sent at 11/13/2016  2:49 PM EDT ----- Please place referral for Lifestyle. Please mail patient gestational diabetes information.   Thanks, JML

## 2016-11-25 ENCOUNTER — Ambulatory Visit (INDEPENDENT_AMBULATORY_CARE_PROVIDER_SITE_OTHER): Payer: BLUE CROSS/BLUE SHIELD | Admitting: Obstetrics and Gynecology

## 2016-11-25 ENCOUNTER — Encounter: Payer: BLUE CROSS/BLUE SHIELD | Attending: Certified Nurse Midwife | Admitting: *Deleted

## 2016-11-25 ENCOUNTER — Encounter: Payer: Self-pay | Admitting: *Deleted

## 2016-11-25 ENCOUNTER — Encounter: Payer: Self-pay | Admitting: Obstetrics and Gynecology

## 2016-11-25 VITALS — BP 109/75 | HR 91 | Ht 62.0 in | Wt 154.7 lb

## 2016-11-25 VITALS — BP 112/70 | Ht 62.0 in | Wt 156.1 lb

## 2016-11-25 DIAGNOSIS — Z713 Dietary counseling and surveillance: Secondary | ICD-10-CM | POA: Diagnosis present

## 2016-11-25 DIAGNOSIS — R87810 Cervical high risk human papillomavirus (HPV) DNA test positive: Secondary | ICD-10-CM | POA: Diagnosis not present

## 2016-11-25 DIAGNOSIS — R87610 Atypical squamous cells of undetermined significance on cytologic smear of cervix (ASC-US): Secondary | ICD-10-CM | POA: Diagnosis not present

## 2016-11-25 DIAGNOSIS — O24419 Gestational diabetes mellitus in pregnancy, unspecified control: Secondary | ICD-10-CM | POA: Insufficient documentation

## 2016-11-25 DIAGNOSIS — O24410 Gestational diabetes mellitus in pregnancy, diet controlled: Secondary | ICD-10-CM

## 2016-11-25 MED ORDER — GLUCOSE BLOOD VI STRP: ORAL_STRIP | 12 refills | Status: DC

## 2016-11-25 MED ORDER — LANCETS MISC: 1.0000 | Freq: Four times a day (QID) | 6 refills | Status: DC

## 2016-11-25 NOTE — Progress Notes (Signed)
ENCOMPASS COLPOSCOPY PROCEDURE NOTE  31 y.o. G1P0 here for colposcopy for ASCUS with POSITIVE high risk HPV pap smear on 11/03/16. Discussed role for HPV in cervical dysplasia, need for surveillance.  Patient given informed consent, signed copy in the chart, time out was performed.  Placed in lithotomy position. Cervix viewed with speculum and colposcope after application of acetic acid.   Colposcopy adequate? Yes  visible lesion(s) at 10 & 4-6 o'clock;   See scanned colpo sheet with detailed drawing.   Will repeat colpo 8-10 weeks after delivery of infant     Melody Suzan Nailer, CNM

## 2016-11-25 NOTE — Progress Notes (Signed)
Diabetes Self-Management Education  Visit Type: First/Initial  Appt. Start Time: 0840 Appt. End Time: 1015  11/25/2016  [name], identified by name and date of birth, is a 31 y.o. female with a diagnosis of Diabetes: Gestational Diabetes.   ASSESSMENT  Blood pressure 112/70, height 5\' 2"  (1.575 m), weight 156 lb 1.6 oz (70.8 kg), last menstrual period 05/31/2016. Body mass index is 28.55 kg/m.      Diabetes Self-Management Education - 11/25/16 1145      Visit Information   Visit Type First/Initial     Initial Visit   Diabetes Type Gestational Diabetes   Are you currently following a meal plan? Yes   What type of meal plan do you follow? "using truvia, 1/2 and 1/2 tea, limited sweets, increased water"   Are you taking your medications as prescribed? Yes   Date Diagnosed 1 week ago     Health Coping   How would you rate your overall health? Good     Psychosocial Assessment   Patient Belief/Attitude about Diabetes Other (comment)  "not surprised"   Self-care barriers None   Self-management support Doctor's office;Family   Other persons present Spouse/SO   Patient Concerns Nutrition/Meal planning;Monitoring   Special Needs None   Preferred Learning Style Auditory   Learning Readiness Change in progress   How often do you need to have someone help you when you read instructions, pamphlets, or other written materials from your doctor or pharmacy? 1 - Never   What is the last grade level you completed in school? college     Pre-Education Assessment   Patient understands the diabetes disease and treatment process. Needs Instruction   Patient understands incorporating nutritional management into lifestyle. Needs Instruction   Patient undertands incorporating physical activity into lifestyle. Needs Instruction   Patient understands using medications safely. Needs Instruction   Patient understands monitoring blood glucose, interpreting and using results Needs  Instruction   Patient understands prevention, detection, and treatment of acute complications. Needs Instruction   Patient understands prevention, detection, and treatment of chronic complications. Needs Instruction   Patient understands how to develop strategies to address psychosocial issues. Needs Instruction   Patient understands how to develop strategies to promote health/change behavior. Needs Instruction     Complications   How often do you check your blood sugar? 0 times/day (not testing)  Provided Contour Next One meter and instructed on use. BG upon return demonstration was 150 mg/dL at 16:10 am - 2 hrs pp. (she had waffle with peanut butter, fruit juice)   Have you had a dilated eye exam in the past 12 months? No   Have you had a dental exam in the past 12 months? No   Are you checking your feet? No     Dietary Intake   Breakfast fruit, waffle, peanut butter, chicken biscuit   Snack (morning) fruit   Lunch sandwich, salad   Snack (afternoon) oatmeal   Dinner meat, vegetables, starches   Beverage(s) water, fruti juice, sugar sweetened tea, coffee     Exercise   Exercise Type ADL's     Patient Education   Previous Diabetes Education No   Disease state  Definition of diabetes, type 1 and 2, and the diagnosis of diabetes   Nutrition management  Role of diet in the treatment of diabetes and the relationship between the three main macronutrients and blood glucose level;Reviewed blood glucose goals for pre and post meals and how to evaluate the patients' food intake on  their blood glucose level.   Physical activity and exercise  Role of exercise on diabetes management, blood pressure control and cardiac health.   Monitoring Taught/evaluated SMBG meter.;Purpose and frequency of SMBG.;Identified appropriate SMBG and/or A1C goals.;Ketone testing, when, how.   Chronic complications Relationship between chronic complications and blood glucose control   Psychosocial adjustment Identified  and addressed patients feelings and concerns about diabetes   Preconception care Pregnancy and GDM  Role of pre-pregnancy blood glucose control on the development of the fetus;Reviewed with patient blood glucose goals with pregnancy;Role of family planning for patients with diabetes     Individualized Goals (developed by patient)   Reducing Risk Prevent diabetes complications     Outcomes   Expected Outcomes Demonstrated interest in learning. Expect positive outcomes   Future DMSE 2 wks      Individualized Plan for Diabetes Self-Management Training:   Learning Objective:  Patient will have a greater understanding of diabetes self-management. Patient education plan is to attend individual and/or group sessions per assessed needs and concerns.   Plan:   Patient Instructions  Read booklet on Gestational Diabetes Follow Gestational Meal Planning Guidelines Avoid fruit juices and sugar sweetened drinks Avoid fruit at breakfast Complete a 3 Day Food Record and bring to next appointment Check blood sugars 4 x day - before breakfast and 2 hrs after every meal and record  Bring blood sugar log to all appointments Call MD for prescription for meter strips and lancets Strips   Contour Next   Lancets   Contour Microlet Purchase urine ketone strips if blood sugars not controlled and check urine ketones every am:  If + increase bedtime snack to 1 protein and 2 carbohydrate servings Walk 20-30 minutes at least 5 x week if permitted by MD   Expected Outcomes:  Demonstrated interest in learning. Expect positive outcomes  Education material provided:  Gestational Booklet Gestational Meal Planning Guidelines Viewed Gestational Diabetes Video Meter - Contour Next One 3 Day Food Record Goals for a Healthy Pregnancy  If problems or questions, patient to contact team via:  Sharion Settler, RN, CCM, CDE [phone]  Future DSME appointment: 2 wks  December 10, 2016 for appointment with the  dietitian

## 2016-11-25 NOTE — Patient Instructions (Signed)
Read booklet on Gestational Diabetes Follow Gestational Meal Planning Guidelines Avoid fruit juices and sugar sweetened drinks Avoid fruit at breakfast Complete a 3 Day Food Record and bring to next appointment Check blood sugars 4 x day - before breakfast and 2 hrs after every meal and record  Bring blood sugar log to all appointments Call MD for prescription for meter strips and lancets Strips   Contour Next   Lancets   Contour Microlet Purchase urine ketone strips if blood sugars not controlled and check urine ketones every am:  If + increase bedtime snack to 1 protein and 2 carbohydrate servings Walk 20-30 minutes at least 5 x week if permitted by MD

## 2016-12-10 ENCOUNTER — Telehealth: Payer: Self-pay | Admitting: Certified Nurse Midwife

## 2016-12-10 ENCOUNTER — Encounter: Payer: Self-pay | Admitting: Certified Nurse Midwife

## 2016-12-10 ENCOUNTER — Ambulatory Visit (INDEPENDENT_AMBULATORY_CARE_PROVIDER_SITE_OTHER): Payer: BLUE CROSS/BLUE SHIELD

## 2016-12-10 ENCOUNTER — Ambulatory Visit (INDEPENDENT_AMBULATORY_CARE_PROVIDER_SITE_OTHER): Payer: BLUE CROSS/BLUE SHIELD | Admitting: Certified Nurse Midwife

## 2016-12-10 ENCOUNTER — Encounter: Payer: Self-pay | Admitting: Dietician

## 2016-12-10 ENCOUNTER — Encounter: Payer: BLUE CROSS/BLUE SHIELD | Attending: Certified Nurse Midwife | Admitting: Dietician

## 2016-12-10 VITALS — BP 120/80 | Ht 62.0 in | Wt 154.1 lb

## 2016-12-10 VITALS — BP 134/85 | HR 108 | Wt 153.5 lb

## 2016-12-10 DIAGNOSIS — Z3402 Encounter for supervision of normal first pregnancy, second trimester: Secondary | ICD-10-CM

## 2016-12-10 DIAGNOSIS — O24419 Gestational diabetes mellitus in pregnancy, unspecified control: Secondary | ICD-10-CM | POA: Diagnosis not present

## 2016-12-10 DIAGNOSIS — E079 Disorder of thyroid, unspecified: Secondary | ICD-10-CM

## 2016-12-10 DIAGNOSIS — O24415 Gestational diabetes mellitus in pregnancy, controlled by oral hypoglycemic drugs: Secondary | ICD-10-CM

## 2016-12-10 DIAGNOSIS — O24410 Gestational diabetes mellitus in pregnancy, diet controlled: Secondary | ICD-10-CM

## 2016-12-10 DIAGNOSIS — Z713 Dietary counseling and surveillance: Secondary | ICD-10-CM | POA: Insufficient documentation

## 2016-12-10 DIAGNOSIS — O99280 Endocrine, nutritional and metabolic diseases complicating pregnancy, unspecified trimester: Secondary | ICD-10-CM

## 2016-12-10 LAB — POCT URINALYSIS DIPSTICK
Bilirubin, UA: NEGATIVE
Blood, UA: NEGATIVE
Glucose, UA: NEGATIVE
KETONES UA: NEGATIVE
Nitrite, UA: NEGATIVE
PROTEIN UA: NEGATIVE
SPEC GRAV UA: 1.025 (ref 1.010–1.025)
Urobilinogen, UA: NEGATIVE E.U./dL — AB
pH, UA: 6 (ref 5.0–8.0)

## 2016-12-10 MED ORDER — GLYBURIDE 2.5 MG PO TABS: 2.5000 mg | ORAL_TABLET | Freq: Two times a day (BID) | ORAL | 0 refills | Status: DC

## 2016-12-10 NOTE — Patient Instructions (Addendum)
Gestational Diabetes Mellitus, Self Care When you have gestational diabetes (gestational diabetes mellitus), you must keep your blood sugar (glucose) under control. You can do this with:  Nutrition.  Exercise.  Lifestyle changes.  Medicines or insulin, if needed.  Support from your doctors and others. How do I manage my blood sugar?  Check your blood sugar every day during pregnancy. Check it as often as told.  Call your doctor if your blood sugar is above your goal numbers for 2 tests in a row. Your doctor will set treatment goals for you. Try to have these blood sugars:  After not eating for a long time (after fasting): at or below 95 mg/dL (5.3 mmol/L).  After meals (postprandial):  One hour after a meal: at or below 140 mg/dL (7.8 mmol/L).  Two hours after a meal: at or below 120 mg/dL (6.7 mmol/L).  A1c (hemoglobin A1c) level: 6-6.5%. What do I need to know about high blood sugar? High blood sugar is called hyperglycemia. Know the early signs of high blood sugar. Signs include:  Feeling:  Thirsty.  Hungry.  Very tired.  Needing to pee (urinate) more than usual.  Blurry vision. What do I need to know about low blood sugar? Low blood sugar is called hypoglycemia. This is when blood sugar is at or below 70 mg/dL (3.9 mmol/L). Symptoms may include:  Feeling:  Hungry.  Worried or nervous (anxious).  Sweaty or clammy.  Confused.  Dizzy.  Sleepy.  Sick to your stomach (nauseous).  Having:  A fast heartbeat.  A headache.  A change in vision.  Jerky movements that you cannot control (seizure).  Nightmares.  Tingling or no feeling (numbness) around the mouth, lips, or tongue.  Having trouble with:  Talking.  Paying attention (concentrating).  Moving (coordination).  Sleeping.  Shaking.  Passing out (fainting).  Getting upset easily (irritability). Treating low blood sugar   To treat low blood sugar, eat or drink something sugary  right away. If you can think clearly and swallow safely, follow the 15:15 rule:  Take 15 grams of a fast-acting carb (carbohydrate). Some fast-acting carbs are:  1 tube of glucose gel.  3 sugar tablets (glucose pills).  6-8 pieces of hard candy.  4 oz (120 mL) of fruit juice.  4 oz (120 mL) regular (not diet) soda.  Check your blood sugar 15 minutes after you take the carb.  If your blood sugar is still at or below 70 mg/dL (3.9 mmol/L), take 15 grams of a carb again.  If your blood sugar does not go above 70 mg/dL (3.9 mmol/L) after 3 tries, get help right away.  After your blood sugar goes back to normal, eat a meal or a snack within 1 hour. Treating very low blood sugar  If your blood sugar is at or below 54 mg/dL (3 mmol/L), you have very low blood sugar (severe hypoglycemia). This is an emergency. Do not wait to see if the symptoms will go away. Get medical help right away. Call your local emergency services (911 in the [place] Do not drive yourself to the hospital. If you have very low blood sugar and you cannot eat or drink, you may need a glucagon shot (injection). A family member or friend should learn:  How to check your blood sugar.  How to give you a glucagon shot. Ask your doctor if you need a glucagon shot kit at home. What else is important to manage my diabetes? Medicine   Take your insulin  and diabetes medicines as told.  Do not run out of insulin or medicines.  Adjust your insulin and diabetes medicines as told. Food    Make healthy food choices. These include:  Chicken, fish, egg whites, and beans.  Oats, whole wheat, bulgur, brown rice, quinoa, and millet.  Fresh fruits and vegetables.  Low-fat dairy products.  Nuts, avocado, olive oil, and canola oil.  Make an eating plan. A food specialist (dietitian) can help you.  Follow instructions from your doctor about what you cannot eat or drink.  Drink enough fluid to keep your pee (urine) clear or  pale yellow.  Eat healthy snacks between healthy meals.  Keep track of carbs you eat. Read food labels. Learn about food serving sizes.  Follow your sick day plan when you cannot eat or drink normally. Make this plan with your doctor. Activity   Exercise 30 minutes or more a day or as much as told by your doctor.  Talk with your doctor if you start a new exercise. Your doctor may need to adjust your insulin, medicines, or food. Lifestyle   Do not drink alcohol.  Do not use any tobacco products. If you need help quitting, ask your doctor.  Learn how to deal with stress. If you need help with this, ask your doctor. Body care    Stay up to date with your shots (immunizations).  Brush your teeth and gums two times a day. Floss at least one time a day.  Go to the dentist least one time every 6 months.  Stay at a healthy weight while you are pregnant. General instructions   Take over-the-counter and prescription medicines only as told by your doctor.  Ask your doctor about risks of high blood pressure in pregnancy. These are called preeclampsia and eclampsia.  Share your diabetes care plan with:  Your work or school.  People you live with.  Check your pee for ketones:  When you are sick.  As told by your doctor.  Ask your doctor:  Do I need to meet with a diabetes educator?  Where can I find a support group for people with diabetes?  Carry a card or wear jewelry that says that you have diabetes.  Keep all follow-up visits with your doctor. This is important. Care after giving birth    Have your blood sugar checked 4-12 weeks after you give birth.  Get checked for diabetes at least every 3 years. Where to find more information: To learn more about diabetes, visit:  American Diabetes Association: www.diabetes.org/diabetes-basics/gestational  Centers for Disease Control and Prevention (CDC): http://sanchez-watson.com/.pdf This  information is not intended to replace advice given to you by your health care provider. Make sure you discuss any questions you have with your health care provider. Document Released: 12/09/2015 Document Revised: 01/23/2016 Document Reviewed: 09/20/2015 Elsevier Interactive Patient Education  2017 [employer]. Glyburide tablets What is this medicine? GLYBURIDE (GLYE byoor ide) helps to treat type 2 diabetes. Treatment is combined with diet and exercise. The medicine helps your body to use insulin better. This medicine may be used for other purposes; ask your health care provider or pharmacist if you have questions. COMMON BRAND NAME(S): Diabeta, Glycron, Glynase PresTab, Micronase What should I tell my health care provider before I take this medicine? They need to know if you have any of these conditions: -diabetic ketoacidosis -glucose-6-phosphate dehydrogenase deficiency -heart disease -kidney disease -liver disease -severe infection or injury -thyroid disease -an unusual or allergic reaction to glyburide,  sulfa drugs, other medicines, foods, dyes, or preservatives -pregnant or trying to get pregnant -breast-feeding How should I use this medicine? Take this medicine by mouth with a glass of water. Follow the directions on the prescription label. If you take this medicine once a day, take it with breakfast or the first main meal of the day. Take your medicine at the same time each day. Do not take more often than directed. Talk to your pediatrician regarding the use of this medicine in children. Special care may be needed. Elderly patients over 77 years old may have a stronger reaction and need a smaller dose. Overdosage: If you think you have taken too much of this medicine contact a poison control center or emergency room at once. NOTE: This medicine is only for you. Do not share this medicine with others. What if I miss a dose? If you miss a dose, take it as soon as you can. If it  is almost time for your next dose, take only that dose. Do not take double or extra doses. What may interact with this medicine? -bosentan -chloramphenicol -cisapride -clarithromycin -medicines for fungal or yeast infections -metoclopramide -probenecid -rifampin -warfarin Many medications may cause an increase or decrease in blood sugar, these include: -alcohol containing beverages -angiotensin converting enzyme inhibitors like enalapril, captopril, and lisinopril -aspirin and aspirin-like drugs -chloramphenicol -chromium -female hormones, like estrogens or progestins and birth control pills -fluoxetine -heart medicines like disopyramide -isoniazid -female hormones or anabolic steroids -medicines called MAO Inhibitors like Nardil, Parnate, Marplan, Eldepryl -medicines for allergies, asthma, cold, or cough -medicines for mental problems -medicines for weight loss -niacin -NSAIDs, medicines for pain and inflammation, like ibuprofen or naproxen -pentamidine -phenytoin -probenecid -quinolone antibiotics like ciprofloxacin, levofloxacin, ofloxacin -some herbal dietary supplements -steroid medicines like prednisone or cortisone -thyroid medicine -water pills or diuretics This list may not describe all possible interactions. Give your health care provider a list of all the medicines, herbs, non-prescription drugs, or dietary supplements you use. Also tell them if you smoke, drink alcohol, or use illegal drugs. Some items may interact with your medicine. What should I watch for while using this medicine? Visit your doctor or health care professional for regular checks on your progress. A test called the HbA1C (A1C) will be monitored. This is a simple blood test. It measures your blood sugar control over the last 2 to 3 months. You will receive this test every 3 to 6 months. Learn how to check your blood sugar. Learn the symptoms of low and high blood sugar and how to manage  them. Always carry a quick-source of sugar with you in case you have symptoms of low blood sugar. Examples include hard sugar candy or glucose tablets. Make sure others know that you can choke if you eat or drink when you develop serious symptoms of low blood sugar, such as seizures or unconsciousness. They must get medical help at once. Tell your doctor or health care professional if you have high blood sugar. You might need to change the dose of your medicine. If you are sick or exercising more than usual, you might need to change the dose of your medicine. Do not skip meals. Ask your doctor or health care professional if you should avoid alcohol. Many nonprescription cough and cold products contain sugar or alcohol. These can affect blood sugar. This medicine can make you more sensitive to the sun. Keep out of the sun. If you cannot avoid being in the sun, wear protective clothing  and use sunscreen. Do not use sun lamps or tanning beds/booths. Wear a medical ID bracelet or chain, and carry a card that describes your disease and details of your medicine and dosage times. What side effects may I notice from receiving this medicine? Side effects that you should report to your doctor or health care professional as soon as possible: -allergic reactions like skin rash, itching or hives, swelling of the face, lips, or tongue -breathing problems -dark urine -fever, chills, sore throat -signs and symptoms of low blood sugar such as feeling anxious, confusion, dizziness, increased hunger, unusually weak or tired, sweating, shakiness, cold, irritable, headache, blurred vision, fast heartbeat, loss of consciousness -unusual bleeding or bruising -yellowing of the eyes or skin Side effects that usually do not require medical attention (report to your doctor or health care professional if they continue or are bothersome): -diarrhea -dizziness -headache -heartburn -nausea -stomach gas This list may not  describe all possible side effects. Call your doctor for medical advice about side effects. You may report side effects to FDA at 1-800-FDA-1088. Where should I keep my medicine? Keep out of the reach of children. Store at room temperature between 15 and 30 degrees C (59 and 86 degrees F). Throw away any unused medicine after the expiration date. NOTE: This sheet is a summary. It may not cover all possible information. If you have questions about this medicine, talk to your doctor, pharmacist, or health care provider.  2018 Elsevier/Gold Standard (2012-11-30 14:44:31)

## 2016-12-10 NOTE — Patient Instructions (Signed)
   Keep working on controlling portions of carbohydrate foods with meals, you seem to be doing a good job so far!  Keep up your walking, this definitely helps blood sugars!  Call with any questions or concerns.

## 2016-12-10 NOTE — Progress Notes (Addendum)
ROB-Pt is doing well. Spouse recently lost his job, so life has been stressful. US findings reviewed, anatomy WNL. Follow up Lifestyles visit today after OB appointment. Blood sugar log and daily food record reviewed, majority of results > 100 fasting and >130 PP. Consulted Dr. Valentino Saxon. Rx Glyburide 2.5mg  BID, see orders. RTC x 1 week for medication check and blood sugar log review. Will need repeat Thyroid panel, hx hypothyroidism. RTC x 4 weeks for ROB. Reviewed red flag symptoms and when to call.   ULTRASOUND REPORT  Location: [facility] Date of Service: 12/10/16  Indications:Anatomy U/S Findings:  Mason Jim intrauterine pregnancy is visualized with FHR at 157 BPM. Biometrics give an (U/S) Gestational age of 64 4/7 weeks and an (U/S) EDD of 04/25/17; this correlates with the clinically established EDD of 04/26/17.  Fetal presentation is Vertex.  EFW: 361g (13 oz). Placenta: Anterior, grade 0, 5.8 cm from internal os. AFI: Adequate with MVP of 4.2 cm.  Anatomic survey is complete and normal; Gender - female  .   Right Ovary measures 4.5 x 2.3 x 3.3 cm. It is normal in appearance. Left Ovary measures 3.4 x 3.5 x 2.1 cm. It is normal appearance. There is evidence of a corpus luteal cyst in the Right ovary. Survey of the adnexa demonstrates no adnexal masses. There is no free peritoneal fluid in the cul de sac.  Impression: 1. 20 4/7 week Viable Singleton Intrauterine pregnancy by U/S. 2. (U/S) EDD is consistent with Clinically established (LMP) EDD of 04/26/17. 3. Normal Anatomy Scan  Recommendations: 1.Clinical correlation with the patient's History and Physical Exam.

## 2016-12-10 NOTE — Telephone Encounter (Signed)
Patient wants to change the pharmacy she has listed to another pharmacy   Please change to: Karin Golden @ [facility] Phone [phone]

## 2016-12-10 NOTE — Progress Notes (Signed)
   Patient's BG record indicates BGs are mostly elevated; she will be starting on Glyburide today.   Patient's food diary indicates she is eating at regular intervals, and generally eating balanced meals. She has further reduced carbohydrate intake in effort to control BGs. She reports having met with RD prior to pregnancy, so has some understanding of basic nutrition.     Provided 1800kcal meal plan, and wrote individualized menus based on patient's food preferences.  Instructed patient on food safety, including avoidance of Listeriosis, and limiting mercury from fish.  Discussed importance of maintaining healthy lifestyle habits to reduce risk of Type 2 DM as well as Gestational DM with any future pregnancies.  Advised patient to use any remaining testing supplies to test some BGs after delivery, and to have BG tested ideally annually, as well as prior to attempting future pregnancies.

## 2016-12-11 NOTE — Telephone Encounter (Signed)
This has been done.

## 2016-12-12 DIAGNOSIS — E079 Disorder of thyroid, unspecified: Secondary | ICD-10-CM | POA: Insufficient documentation

## 2016-12-12 DIAGNOSIS — O99280 Endocrine, nutritional and metabolic diseases complicating pregnancy, unspecified trimester: Secondary | ICD-10-CM

## 2016-12-13 ENCOUNTER — Encounter: Payer: Self-pay | Admitting: Certified Nurse Midwife

## 2016-12-15 DIAGNOSIS — O24419 Gestational diabetes mellitus in pregnancy, unspecified control: Secondary | ICD-10-CM | POA: Insufficient documentation

## 2016-12-17 ENCOUNTER — Other Ambulatory Visit: Payer: Self-pay

## 2016-12-17 MED ORDER — GLYBURIDE 2.5 MG PO TABS
5.0000 mg | ORAL_TABLET | Freq: Every day | ORAL | 1 refills | Status: DC
Start: 2016-12-17 — End: 2017-02-04

## 2016-12-23 ENCOUNTER — Encounter: Payer: Self-pay | Admitting: Obstetrics and Gynecology

## 2016-12-27 ENCOUNTER — Encounter: Payer: Self-pay | Admitting: Certified Nurse Midwife

## 2016-12-28 ENCOUNTER — Other Ambulatory Visit: Payer: Self-pay

## 2016-12-28 DIAGNOSIS — E079 Disorder of thyroid, unspecified: Secondary | ICD-10-CM

## 2016-12-28 DIAGNOSIS — O24410 Gestational diabetes mellitus in pregnancy, diet controlled: Secondary | ICD-10-CM

## 2016-12-28 MED ORDER — LEVOTHYROXINE SODIUM 25 MCG PO TABS: 25.0000 ug | ORAL_TABLET | Freq: Every day | ORAL | 3 refills | Status: DC

## 2016-12-28 MED ORDER — GLUCOSE BLOOD VI STRP
ORAL_STRIP | 12 refills | Status: DC
Start: 2016-12-28 — End: 2017-03-28

## 2017-01-07 ENCOUNTER — Encounter: Payer: Self-pay | Admitting: Certified Nurse Midwife

## 2017-01-07 ENCOUNTER — Ambulatory Visit (INDEPENDENT_AMBULATORY_CARE_PROVIDER_SITE_OTHER): Payer: BLUE CROSS/BLUE SHIELD | Admitting: Certified Nurse Midwife

## 2017-01-07 VITALS — BP 113/80 | HR 96 | Wt 161.8 lb

## 2017-01-07 DIAGNOSIS — Z3402 Encounter for supervision of normal first pregnancy, second trimester: Secondary | ICD-10-CM

## 2017-01-07 LAB — POCT URINALYSIS DIPSTICK
Bilirubin, UA: NEGATIVE
Leukocytes, UA: NEGATIVE
NITRITE UA: NEGATIVE
PH UA: 6 (ref 5.0–8.0)
Protein, UA: NEGATIVE
RBC UA: NEGATIVE
Spec Grav, UA: 1.02 (ref 1.010–1.025)
UROBILINOGEN UA: 0.2 U/dL

## 2017-01-07 NOTE — Patient Instructions (Signed)

## 2017-01-07 NOTE — Progress Notes (Signed)
ROB, doing well. Reviewed CBC and Thyroid at next visit. Blood sugars reviewed She continues to have elevated ranges, the majority of which are 2 hr pp (120-202). Discussed diet. She states that she "crashes at 11 am so she has been drinking a Coke to prevent from crashing. Recommended that she have a health protein snack and to avoid Cokes. Will refer back to life styles. Glyburide dose changed to 5 mg BID. Instructed her to continue Log and to keep journal of diet to review with nutritionist.Ecouraged her to return in one week to follow up on BS log. Pt states that it is difficult for her to get off of work and request to send BS log via my chart. Reviewed risks of GDM and pregnancy and possible need for insulin. She Denies LOF, Vaginal bleeding and contractions. Endorses good fetal movement, discussed fetal movement as an indication of well being. ROB in 4 wks.   Doreene Burke, CNM

## 2017-01-22 ENCOUNTER — Encounter: Payer: Self-pay | Admitting: Certified Nurse Midwife

## 2017-02-04 ENCOUNTER — Other Ambulatory Visit: Payer: BLUE CROSS/BLUE SHIELD

## 2017-02-04 ENCOUNTER — Encounter: Payer: Self-pay | Admitting: Certified Nurse Midwife

## 2017-02-04 ENCOUNTER — Ambulatory Visit (INDEPENDENT_AMBULATORY_CARE_PROVIDER_SITE_OTHER): Payer: BLUE CROSS/BLUE SHIELD | Admitting: Certified Nurse Midwife

## 2017-02-04 VITALS — BP 115/84 | HR 95 | Wt 165.6 lb

## 2017-02-04 DIAGNOSIS — E079 Disorder of thyroid, unspecified: Secondary | ICD-10-CM

## 2017-02-04 DIAGNOSIS — O99280 Endocrine, nutritional and metabolic diseases complicating pregnancy, unspecified trimester: Secondary | ICD-10-CM

## 2017-02-04 DIAGNOSIS — Z3403 Encounter for supervision of normal first pregnancy, third trimester: Secondary | ICD-10-CM

## 2017-02-04 DIAGNOSIS — Z130 Encounter for screening for diseases of the blood and blood-forming organs and certain disorders involving the immune mechanism: Secondary | ICD-10-CM

## 2017-02-04 LAB — POCT URINALYSIS DIPSTICK
Bilirubin, UA: NEGATIVE
KETONES UA: NEGATIVE
Nitrite, UA: NEGATIVE
PH UA: 6 (ref 5.0–8.0)
PROTEIN UA: NEGATIVE
RBC UA: NEGATIVE
SPEC GRAV UA: 1.02 (ref 1.010–1.025)
UROBILINOGEN UA: 0.2 U/dL

## 2017-02-04 MED ORDER — GLYBURIDE 2.5 MG PO TABS
10.0000 mg | ORAL_TABLET | Freq: Every day | ORAL | 1 refills | Status: DC
Start: 2017-02-04 — End: 2017-03-17

## 2017-02-04 NOTE — Progress Notes (Signed)
ROB-Pt doing well, accompanied by spouse to today's visit. Blood sugar log reviewed. Consulted Dr. Valentino Saxon for oral medication titration. Will increase morning dose of Glyburide 10 mg with breakfast while evening dose will remain 7.5 mg with dinner. Encouraged abdominal support and compression stockings as home treatment measures for leg swelling and pain. Advised home treatment measures for seasonal allergies and dry skin. Discussed childbirth classes and intrapartum pain management options. Desires immediate skin to skin contact. Plans breastfeeding, delayed cord clamping, and cord blood banking. Does not want an epidural. Thyroid panel with TSH and H/H today. Reviewed red flag symptoms and when to call. Send blood sugar log via MyChart x 1 week. RTC x 2 weeks for ROB.

## 2017-02-04 NOTE — Patient Instructions (Addendum)
Third Trimester of Pregnancy The third trimester is from week 28 through week 40 (months 7 through 9). The third trimester is a time when the unborn baby (fetus) is growing rapidly. At the end of the ninth month, the fetus is about 20 inches in length and weighs 6-10 pounds. Body changes during your third trimester Your body will continue to go through many changes during pregnancy. The changes vary from woman to woman. During the third trimester:  Your weight will continue to increase. You can expect to gain 25-35 pounds (11-16 kg) by the end of the pregnancy.  You may begin to get stretch marks on your hips, abdomen, and breasts.  You may urinate more often because the fetus is moving lower into your pelvis and pressing on your bladder.  You may develop or continue to have heartburn. This is caused by increased hormones that slow down muscles in the digestive tract.  You may develop or continue to have constipation because increased hormones slow digestion and cause the muscles that push waste through your intestines to relax.  You may develop hemorrhoids. These are swollen veins (varicose veins) in the rectum that can itch or be painful.  You may develop swollen, bulging veins (varicose veins) in your legs.  You may have increased body aches in the pelvis, back, or thighs. This is due to weight gain and increased hormones that are relaxing your joints.  You may have changes in your hair. These can include thickening of your hair, rapid growth, and changes in texture. Some women also have hair loss during or after pregnancy, or hair that feels dry or thin. Your hair will most likely return to normal after your baby is born.  Your breasts will continue to grow and they will continue to become tender. A yellow fluid (colostrum) may leak from your breasts. This is the first milk you are producing for your baby.  Your belly button may stick out.  You may notice more swelling in your hands,  face, or ankles.  You may have increased tingling or numbness in your hands, arms, and legs. The skin on your belly may also feel numb.  You may feel short of breath because of your expanding uterus.  You may have more problems sleeping. This can be caused by the size of your belly, increased need to urinate, and an increase in your body's metabolism.  You may notice the fetus "dropping," or moving lower in your abdomen (lightening).  You may have increased vaginal discharge.  You may notice your joints feel loose and you may have pain around your pelvic bone.  What to expect at prenatal visits You will have prenatal exams every 2 weeks until week 36. Then you will have weekly prenatal exams. During a routine prenatal visit:  You will be weighed to make sure you and the baby are growing normally.  Your blood pressure will be taken.  Your abdomen will be measured to track your baby's growth.  The fetal heartbeat will be listened to.  Any test results from the previous visit will be discussed.  You may have a cervical check near your due date to see if your cervix has softened or thinned (effaced).  You will be tested for Group B streptococcus. This happens between 35 and 37 weeks.  Your health care provider may ask you:  What your birth plan is.  How you are feeling.  If you are feeling the baby move.  If you have had  any abnormal symptoms, such as leaking fluid, bleeding, severe headaches, or abdominal cramping.  If you are using any tobacco products, including cigarettes, chewing tobacco, and electronic cigarettes.  If you have any questions.  Other tests or screenings that may be performed during your third trimester include:  Blood tests that check for low iron levels (anemia).  Fetal testing to check the health, activity level, and growth of the fetus. Testing is done if you have certain medical conditions or if there are problems during the  pregnancy.  Nonstress test (NST). This test checks the health of your baby to make sure there are no signs of problems, such as the baby not getting enough oxygen. During this test, a belt is placed around your belly. The baby is made to move, and its heart rate is monitored during movement.  What is false labor? False labor is a condition in which you feel small, irregular tightenings of the muscles in the womb (contractions) that usually go away with rest, changing position, or drinking water. These are called Braxton Hicks contractions. Contractions may last for hours, days, or even weeks before true labor sets in. If contractions come at regular intervals, become more frequent, increase in intensity, or become painful, you should see your health care provider. What are the signs of labor?  Abdominal cramps.  Regular contractions that start at 10 minutes apart and become stronger and more frequent with time.  Contractions that start on the top of the uterus and spread down to the lower abdomen and back.  Increased pelvic pressure and dull back pain.  A watery or bloody mucus discharge that comes from the vagina.  Leaking of amniotic fluid. This is also known as your "water breaking." It could be a slow trickle or a gush. Let your health care provider know if it has a color or strange odor. If you have any of these signs, call your health care provider right away, even if it is before your due date. Follow these instructions at home: Medicines  Follow your health care provider's instructions regarding medicine use. Specific medicines may be either safe or unsafe to take during pregnancy.  Take a prenatal vitamin that contains at least 600 micrograms (mcg) of folic acid.  If you develop constipation, try taking a stool softener if your health care provider approves. Eating and drinking  Eat a balanced diet that includes fresh fruits and vegetables, whole grains, good sources of protein  such as meat, eggs, or tofu, and low-fat dairy. Your health care provider will help you determine the amount of weight gain that is right for you.  Avoid raw meat and uncooked cheese. These carry germs that can cause birth defects in the baby.  If you have low calcium intake from food, talk to your health care provider about whether you should take a daily calcium supplement.  Eat four or five small meals rather than three large meals a day.  Limit foods that are high in fat and processed sugars, such as fried and sweet foods.  To prevent constipation: ? Drink enough fluid to keep your urine clear or pale yellow. ? Eat foods that are high in fiber, such as fresh fruits and vegetables, whole grains, and beans. Activity  Exercise only as directed by your health care provider. Most women can continue their usual exercise routine during pregnancy. Try to exercise for 30 minutes at least 5 days a week. Stop exercising if you experience uterine contractions.  Avoid heavy  lifting.  Do not exercise in extreme heat or humidity, or at high altitudes.  Wear low-heel, comfortable shoes.  Practice good posture.  You may continue to have sex unless your health care provider tells you otherwise. Relieving pain and discomfort  Take frequent breaks and rest with your legs elevated if you have leg cramps or low back pain.  Take warm sitz baths to soothe any pain or discomfort caused by hemorrhoids. Use hemorrhoid cream if your health care provider approves.  Wear a good support bra to prevent discomfort from breast tenderness.  If you develop varicose veins: ? Wear support pantyhose or compression stockings as told by your healthcare provider. ? Elevate your feet for 15 minutes, 3-4 times a day. Prenatal care  Write down your questions. Take them to your prenatal visits.  Keep all your prenatal visits as told by your health care provider. This is important. Safety  Wear your seat belt at  all times when driving.  Make a list of emergency phone numbers, including numbers for family, friends, the hospital, and police and fire departments. General instructions  Avoid cat litter boxes and soil used by cats. These carry germs that can cause birth defects in the baby. If you have a cat, ask someone to clean the litter box for you.  Do not travel far distances unless it is absolutely necessary and only with the approval of your health care provider.  Do not use hot tubs, steam rooms, or saunas.  Do not drink alcohol.  Do not use any products that contain nicotine or tobacco, such as cigarettes and e-cigarettes. If you need help quitting, ask your health care provider.  Do not use any medicinal herbs or unprescribed drugs. These chemicals affect the formation and growth of the baby.  Do not douche or use tampons or scented sanitary pads.  Do not cross your legs for long periods of time.  To prepare for the arrival of your baby: ? Take prenatal classes to understand, practice, and ask questions about labor and delivery. ? Make a trial run to the hospital. ? Visit the hospital and tour the maternity area. ? Arrange for maternity or paternity leave through employers. ? Arrange for family and friends to take care of pets while you are in the hospital. ? Purchase a rear-facing car seat and make sure you know how to install it in your car. ? Pack your hospital bag. ? Prepare the baby's nursery. Make sure to remove all pillows and stuffed animals from the baby's crib to prevent suffocation.  Visit your dentist if you have not gone during your pregnancy. Use a soft toothbrush to brush your teeth and be gentle when you floss. Contact a health care provider if:  You are unsure if you are in labor or if your water has broken.  You become dizzy.  You have mild pelvic cramps, pelvic pressure, or nagging pain in your abdominal area.  You have lower back pain.  You have persistent  nausea, vomiting, or diarrhea.  You have an unusual or bad smelling vaginal discharge.  You have pain when you urinate. Get help right away if:  Your water breaks before 37 weeks.  You have regular contractions less than 5 minutes apart before 37 weeks.  You have a fever.  You are leaking fluid from your vagina.  You have spotting or bleeding from your vagina.  You have severe abdominal pain or cramping.  You have rapid weight loss or weight gain.  You have shortness of breath with chest pain.  You notice sudden or extreme swelling of your face, hands, ankles, feet, or legs.  Your baby makes fewer than 10 movements in 2 hours.  You have severe headaches that do not go away when you take medicine.  You have vision changes. Summary  The third trimester is from week 28 through week 40, months 7 through 9. The third trimester is a time when the unborn baby (fetus) is growing rapidly.  During the third trimester, your discomfort may increase as you and your baby continue to gain weight. You may have abdominal, leg, and back pain, sleeping problems, and an increased need to urinate.  During the third trimester your breasts will keep growing and they will continue to become tender. A yellow fluid (colostrum) may leak from your breasts. This is the first milk you are producing for your baby.  False labor is a condition in which you feel small, irregular tightenings of the muscles in the womb (contractions) that eventually go away. These are called Braxton Hicks contractions. Contractions may last for hours, days, or even weeks before true labor sets in.  Signs of labor can include: abdominal cramps; regular contractions that start at 10 minutes apart and become stronger and more frequent with time; watery or bloody mucus discharge that comes from the vagina; increased pelvic pressure and dull back pain; and leaking of amniotic fluid. This information is not intended to replace advice  given to you by your health care provider. Make sure you discuss any questions you have with your health care provider. Document Released: 08/11/2001 Document Revised: 01/23/2016 Document Reviewed: 10/18/2012 Elsevier Interactive Patient Education  2017 [place] 2018 Prenatal Education Class Schedule Register at [email or web address] in the [place] or call Kerman Passey at [phone] 9:00a-5:00p M-F  Childbirth Preparation Certified Childbirth Educators teach this 5 week course.  Expectant parents are encouraged to take this class in their 3rd trimester, completing it by their 35-36th week. Meets in [facility], Lower Level.  Mondays Thursdays  7:00-9:00 p 7:00-9:00 p  July 23 - August 20 July 19 - August 16  September 17 - October 15 September 6 -October 4  November 5 - December 3 October 25 - November 29   No Class on Thanksgiving Day -November 22  Childbirth Preparation Refresher Course For those who have previously attended Prepared Childbirth Preparation classes, this class in incorporated into the 3rd and 4th classes in the Monday night childbirth series.  Course meets in the [facility]. Lower Level from 7:00p - 9:00p  August 6 & 13  October 1 & 8  November 19 & 26   Weekend Childbirth Waymon Amato Classes are held Saturday & Sunday, 1:00 5:00p Course meets in [facility] [place] Level  August 4 & 5  November 3 & 4    The Birth[facility] Tours Free tours are held on the third Sunday of each month at 3 pm.  The tour meets in the third floor waiting area and will take approximately 30 minutes.  Tours are also included in Childbirth class series as well as Brother/Sister class.  An online virtual tour can be seen at [email or web address].         Breastfeeding & Infant Nutrition The course incorporates returning to work or school.  Breast milk collection and storage with basic breastfeeding and  infant nutrition. This two-class course is held the 2nd and 3rd Tuesday of each  month from 7:00 -9:00 pm.  Course meets in the [facility] Lower level  June 12 & 19 July-No Class  August 14 & 21 September 11 &18  September 11 & 18 October 9 &16  November 13 & 20 December 11 & 18   [employer] welcomes any mother for a social outing with other Moms to share experiences and challenges in an informal setting.  Meets the 1st Thursday and 3rd Thursday 11:30a-1:00 pm of each month in [facility] 3rd floor classroom.  No registration required.  Newborn Essentials This course covers bathing, diapering, swaddling and more with practice on lifelike dolls.  Participants will also learn safety tips and infant CPR (Not for certification).  It is held the 1st Wednesday of each month from 7:00p-9:00p in the [facility], Lower level.  June 6 July- No Class  August 1 September 5  October 3 November 7  December 7    Preparing Big Brother & Sister This one session course prepares children and their parents for the arrival of a new baby.  It is held on the 1st Tuesday of each month from 6:30p - 8:00p. Course meets in the [facility], Lower level.  July-No Class August 7  September 4 October 2  November 6 December 4   [employer] for [employer] Dads This nationally acclaimed class helps expecting and new dads with the basic skills and confidence to bond with their infants, support their mates, and provide a safe and healthy home environment for their new family. Classes are held the 2nd Saturday of every month from 9:00a - 12:00 noon.  Course meets in the [facility] Lower level.  June 9 August 11  October 13 No Class in December   Cord Blood Banking Information Cord blood banking is the process of collecting and storing the blood that is in the umbilical cord and placenta at the time of delivery. This blood contains stem cells, which can be used to treat many blood  diseases, immune system disorders, and childhood cancers. Stem cells can also be used to research certain diseases and treatments. Many people who choose cord blood banking donate the blood. Donated blood can be used in lifesaving treatments or for research. Other people choose to store the blood privately. Blood that is stored privately can only be used with the person's permission. This option is often chosen if:  A family member needs a stem cell transplant.  The child is part of an ethnic minority.  The child was conceived through in vitro fertilization.  What should I look for in a blood bank? A blood bank is the organization that coordinates cord blood banking. Make sure the cord blood bank that you use:  Is accredited.  Is financially stable.  Handles a large volume of cord blood samples.  Has a procedure in place for transport and storage.  Allows you the option of transferring your cord blood sample.  Has a procedure in place if the bank goes out of business.  Clearly states all costs and limits to future costs.  People who choose to donate cord blood should not need to pay for blood banking. People who keep the blood for private use will need to pay for the first (initial) storage and pay a fee each year (annual fee). Other fees may also apply. What are the risks of cord blood banking? There are no health risks associated with cord blood banking. It is considered safe.  How should I prepare? You must schedule this process at least 4-6 weeks before you will be giving birth. How is the blood collected? The blood is collected as soon as the baby has been delivered. Within 15 minutes of delivery, a health care provider will take these actions to collect the blood:  Clamp the umbilical cord at the top and bottom. This traps the blood in the umbilical cord.  Use a syringe or bag to collect the blood.  Insert needles into the placenta to collect (draw out) more blood.  What  happens after the blood is collected? After the blood has been collected:  The blood will be sent to a blood bank.  The blood will be tested for genetic problems and infectious diseases. If the blood tests positive for a genetic problem or a disease, someone will contact you and let you know.  The blood will be frozen.  If your child develops a genetic condition, immune system disorder, or cancer, you will be responsible for contacting the blood bank and letting them know. This information is not intended to replace advice given to you by your health care provider. Make sure you discuss any questions you have with your health care provider. Document Released: 02/04/2010 Document Revised: 01/23/2016 Document Reviewed: 02/04/2015 Elsevier Interactive Patient Education  2018 [employer].  Breastfeeding Deciding to breastfeed is one of the best choices you can make for you and your baby. A change in hormones during pregnancy causes your breast tissue to grow and increases the number and size of your milk ducts. These hormones also allow proteins, sugars, and fats from your blood supply to make breast milk in your milk-producing glands. Hormones prevent breast milk from being released before your baby is born as well as prompt milk flow after birth. Once breastfeeding has begun, thoughts of your baby, as well as his or her sucking or crying, can stimulate the release of milk from your milk-producing glands. Benefits of breastfeeding For Your Baby  Your first milk (colostrum) helps your baby's digestive system function better.  There are antibodies in your milk that help your baby fight off infections.  Your baby has a lower incidence of asthma, allergies, and sudden infant death syndrome.  The nutrients in breast milk are better for your baby than infant formulas and are designed uniquely for your baby's needs.  Breast milk improves your baby's brain development.  Your baby is less likely  to develop other conditions, such as childhood obesity, asthma, or type 2 diabetes mellitus.  For You  Breastfeeding helps to create a very special bond between you and your baby.  Breastfeeding is convenient. Breast milk is always available at the correct temperature and costs nothing.  Breastfeeding helps to burn calories and helps you lose the weight gained during pregnancy.  Breastfeeding makes your uterus contract to its prepregnancy size faster and slows bleeding (lochia) after you give birth.  Breastfeeding helps to lower your risk of developing type 2 diabetes mellitus, osteoporosis, and breast or ovarian cancer later in life.  Signs that your baby is hungry Early Signs of Hunger  Increased alertness or activity.  Stretching.  Movement of the head from side to side.  Movement of the head and opening of the mouth when the corner of the mouth or cheek is stroked (rooting).  Increased sucking sounds, smacking lips, cooing, sighing, or squeaking.  Hand-to-mouth movements.  Increased sucking of fingers or hands.  Late Signs of Hunger  Fussing.  Intermittent  crying.  Extreme Signs of Hunger Signs of extreme hunger will require calming and consoling before your baby will be able to breastfeed successfully. Do not wait for the following signs of extreme hunger to occur before you initiate breastfeeding:  Restlessness.  A loud, strong cry.  Screaming.  Breastfeeding basics Breastfeeding Initiation  Find a comfortable place to sit or lie down, with your neck and back well supported.  Place a pillow or rolled up blanket under your baby to bring him or her to the level of your breast (if you are seated). Nursing pillows are specially designed to help support your arms and your baby while you breastfeed.  Make sure that your baby's abdomen is facing your abdomen.  Gently massage your breast. With your fingertips, massage from your chest wall toward your nipple in a  circular motion. This encourages milk flow. You may need to continue this action during the feeding if your milk flows slowly.  Support your breast with 4 fingers underneath and your thumb above your nipple. Make sure your fingers are well away from your nipple and your baby's mouth.  Stroke your baby's lips gently with your finger or nipple.  When your baby's mouth is open wide enough, quickly bring your baby to your breast, placing your entire nipple and as much of the colored area around your nipple (areola) as possible into your baby's mouth. ? More areola should be visible above your baby's upper lip than below the lower lip. ? Your baby's tongue should be between his or her lower gum and your breast.  Ensure that your baby's mouth is correctly positioned around your nipple (latched). Your baby's lips should create a seal on your breast and be turned out (everted).  It is common for your baby to suck about 2-3 minutes in order to start the flow of breast milk.  Latching Teaching your baby how to latch on to your breast properly is very important. An improper latch can cause nipple pain and decreased milk supply for you and poor weight gain in your baby. Also, if your baby is not latched onto your nipple properly, he or she may swallow some air during feeding. This can make your baby fussy. Burping your baby when you switch breasts during the feeding can help to get rid of the air. However, teaching your baby to latch on properly is still the best way to prevent fussiness from swallowing air while breastfeeding. Signs that your baby has successfully latched on to your nipple:  Silent tugging or silent sucking, without causing you pain.  Swallowing heard between every 3-4 sucks.  Muscle movement above and in front of his or her ears while sucking.  Signs that your baby has not successfully latched on to nipple:  Sucking sounds or smacking sounds from your baby while  breastfeeding.  Nipple pain.  If you think your baby has not latched on correctly, slip your finger into the corner of your baby's mouth to break the suction and place it between your baby's gums. Attempt breastfeeding initiation again. Signs of Successful Breastfeeding Signs from your baby:  A gradual decrease in the number of sucks or complete cessation of sucking.  Falling asleep.  Relaxation of his or her body.  Retention of a small amount of milk in his or her mouth.  Letting go of your breast by himself or herself.  Signs from you:  Breasts that have increased in firmness, weight, and size 1-3 hours after feeding.  Breasts that are softer immediately after breastfeeding.  Increased milk volume, as well as a change in milk consistency and color by the fifth day of breastfeeding.  Nipples that are not sore, cracked, or bleeding.  Signs That Your Pecola Leisure is Getting Enough Milk  Wetting at least 1-2 diapers during the first 24 hours after birth.  Wetting at least 5-6 diapers every 24 hours for the first week after birth. The urine should be clear or pale yellow by 5 days after birth.  Wetting 6-8 diapers every 24 hours as your baby continues to grow and develop.  At least 3 stools in a 24-hour period by age 36 days. The stool should be soft and yellow.  At least 3 stools in a 24-hour period by age 60 days. The stool should be seedy and yellow.  No loss of weight greater than 10% of birth weight during the first 98 days of age.  Average weight gain of 4-7 ounces (113-198 g) per week after age 259 days.  Consistent daily weight gain by age 36 days, without weight loss after the age of 2 weeks.  After a feeding, your baby may spit up a small amount. This is common. Breastfeeding frequency and duration Frequent feeding will help you make more milk and can prevent sore nipples and breast engorgement. Breastfeed when you feel the need to reduce the fullness of your breasts or when  your baby shows signs of hunger. This is called "breastfeeding on demand." Avoid introducing a pacifier to your baby while you are working to establish breastfeeding (the first 4-6 weeks after your baby is born). After this time you may choose to use a pacifier. Research has shown that pacifier use during the first year of a baby's life decreases the risk of sudden infant death syndrome (SIDS). Allow your baby to feed on each breast as long as he or she wants. Breastfeed until your baby is finished feeding. When your baby unlatches or falls asleep while feeding from the first breast, offer the second breast. Because newborns are often sleepy in the first few weeks of life, you may need to awaken your baby to get him or her to feed. Breastfeeding times will vary from baby to baby. However, the following rules can serve as a guide to help you ensure that your baby is properly fed:  Newborns (babies 102 weeks of age or younger) may breastfeed every 1-3 hours.  Newborns should not go longer than 3 hours during the day or 5 hours during the night without breastfeeding.  You should breastfeed your baby a minimum of 8 times in a 24-hour period until you begin to introduce solid foods to your baby at around 64 months of age.  Breast milk pumping Pumping and storing breast milk allows you to ensure that your baby is exclusively fed your breast milk, even at times when you are unable to breastfeed. This is especially important if you are going back to work while you are still breastfeeding or when you are not able to be present during feedings. Your lactation consultant can give you guidelines on how long it is safe to store breast milk. A breast pump is a machine that allows you to pump milk from your breast into a sterile bottle. The pumped breast milk can then be stored in a refrigerator or freezer. Some breast pumps are operated by hand, while others use electricity. Ask your lactation consultant which type will  work best for you. Breast pumps  can be purchased, but some hospitals and breastfeeding support groups lease breast pumps on a monthly basis. A lactation consultant can teach you how to hand express breast milk, if you prefer not to use a pump. Caring for your breasts while you breastfeed Nipples can become dry, cracked, and sore while breastfeeding. The following recommendations can help keep your breasts moisturized and healthy:  Avoid using soap on your nipples.  Wear a supportive bra. Although not required, special nursing bras and tank tops are designed to allow access to your breasts for breastfeeding without taking off your entire bra or top. Avoid wearing underwire-style bras or extremely tight bras.  Air dry your nipples for 3-55mnutes after each feeding.  Use only cotton bra pads to absorb leaked breast milk. Leaking of breast milk between feedings is normal.  Use lanolin on your nipples after breastfeeding. Lanolin helps to maintain your skin's normal moisture barrier. If you use pure lanolin, you do not need to wash it off before feeding your baby again. Pure lanolin is not toxic to your baby. You may also hand express a few drops of breast milk and gently massage that milk into your nipples and allow the milk to air dry.  In the first few weeks after giving birth, some women experience extremely full breasts (engorgement). Engorgement can make your breasts feel heavy, warm, and tender to the touch. Engorgement peaks within 3-5 days after you give birth. The following recommendations can help ease engorgement:  Completely empty your breasts while breastfeeding or pumping. You may want to start by applying warm, moist heat (in the shower or with warm water-soaked hand towels) just before feeding or pumping. This increases circulation and helps the milk flow. If your baby does not completely empty your breasts while breastfeeding, pump any extra milk after he or she is finished.  Wear a  snug bra (nursing or regular) or tank top for 1-2 days to signal your body to slightly decrease milk production.  Apply ice packs to your breasts, unless this is too uncomfortable for you.  Make sure that your baby is latched on and positioned properly while breastfeeding.  If engorgement persists after 48 hours of following these recommendations, contact your health care provider or a lScience writer Overall health care recommendations while breastfeeding  Eat healthy foods. Alternate between meals and snacks, eating 3 of each per day. Because what you eat affects your breast milk, some of the foods may make your baby more irritable than usual. Avoid eating these foods if you are sure that they are negatively affecting your baby.  Drink milk, fruit juice, and water to satisfy your thirst (about 10 glasses a day).  Rest often, relax, and continue to take your prenatal vitamins to prevent fatigue, stress, and anemia.  Continue breast self-awareness checks.  Avoid chewing and smoking tobacco. Chemicals from cigarettes that pass into breast milk and exposure to secondhand smoke may harm your baby.  Avoid alcohol and drug use, including marijuana. Some medicines that may be harmful to your baby can pass through breast milk. It is important to ask your health care provider before taking any medicine, including all over-the-counter and prescription medicine as well as vitamin and herbal supplements. It is possible to become pregnant while breastfeeding. If birth control is desired, ask your health care provider about options that will be safe for your baby. Contact a health care provider if:  You feel like you want to stop breastfeeding or have become frustrated with  breastfeeding.  You have painful breasts or nipples.  Your nipples are cracked or bleeding.  Your breasts are red, tender, or warm.  You have a swollen area on either breast.  You have a fever or chills.  You have  nausea or vomiting.  You have drainage other than breast milk from your nipples.  Your breasts do not become full before feedings by the fifth day after you give birth.  You feel sad and depressed.  Your baby is too sleepy to eat well.  Your baby is having trouble sleeping.  Your baby is wetting less than 3 diapers in a 24-hour period.  Your baby has less than 3 stools in a 24-hour period.  Your baby's skin or the white part of his or her eyes becomes yellow.  Your baby is not gaining weight by 44 days of age. Get help right away if:  Your baby is overly tired (lethargic) and does not want to wake up and feed.  Your baby develops an unexplained fever. This information is not intended to replace advice given to you by your health care provider. Make sure you discuss any questions you have with your health care provider. Document Released: 08/17/2005 Document Revised: 01/29/2016 Document Reviewed: 02/08/2013 Elsevier Interactive Patient Education  2017 [employer]. Round Ligament Pain The round ligament is a cord of muscle and tissue that helps to support the uterus. It can become a source of pain during pregnancy if it becomes stretched or twisted as the baby grows. The pain usually begins in the second trimester of pregnancy, and it can come and go until the baby is delivered. It is not a serious problem, and it does not cause harm to the baby. Round ligament pain is usually a short, sharp, and pinching pain, but it can also be a dull, lingering, and aching pain. The pain is felt in the lower side of the abdomen or in the groin. It usually starts deep in the groin and moves up to the outside of the hip area. Pain can occur with:  A sudden change in position.  Rolling over in bed.  Coughing or sneezing.  Physical activity.  Follow these instructions at home: Watch your condition for any changes. Take these steps to help with your pain:  When the pain starts, relax. Then  try: ? Sitting down. ? Flexing your knees up to your abdomen. ? Lying on your side with one pillow under your abdomen and another pillow between your legs. ? Sitting in a warm bath for 15-20 minutes or until the pain goes away.  Take over-the-counter and prescription medicines only as told by your health care provider.  Move slowly when you sit and stand.  Avoid long walks if they cause pain.  Stop or lessen your physical activities if they cause pain.  Contact a health care provider if:  Your pain does not go away with treatment.  You feel pain in your back that you did not have before.  Your medicine is not helping. Get help right away if:  You develop a fever or chills.  You develop uterine contractions.  You develop vaginal bleeding.  You develop nausea or vomiting.  You develop diarrhea.  You have pain when you urinate. This information is not intended to replace advice given to you by your health care provider. Make sure you discuss any questions you have with your health care provider. Document Released: 05/26/2008 Document Revised: 01/23/2016 Document Reviewed: 10/24/2014 Elsevier Interactive Patient  Education  2018 [employer]. Common Medications Safe in Pregnancy  Acne:      Constipation:  Benzoyl Peroxide     Colace  Clindamycin      Dulcolax Suppository  Topica Erythromycin     Fibercon  Salicylic Acid      Metamucil         Miralax AVOID:        Senakot   Accutane    Cough:  Retin-A       Cough Drops  Tetracycline      Phenergan w/ Codeine if Rx  Minocycline      Robitussin (Plain & DM)  Antibiotics:     Crabs/Lice:  Ceclor       RID  Cephalosporins    AVOID:  E-Mycins      Kwell  Keflex  Macrobid/Macrodantin   Diarrhea:  Penicillin      Kao-Pectate  Zithromax      Imodium AD         PUSH FLUIDS AVOID:       Cipro     Fever:  Tetracycline      Tylenol (Regular or Extra  Minocycline       Strength)  Levaquin      Extra Strength-Do not           Exceed 8 tabs/24 hrs Caffeine:        '200mg'$ /day (equiv. To 1 cup of coffee or  approx. 3 12 oz sodas)         Gas: Cold/Hayfever:       Gas-X  Benadryl      Mylicon  Claritin       Phazyme  **Claritin-D        Chlor-Trimeton    Headaches:  Dimetapp      ASA-Free Excedrin  Drixoral-Non-Drowsy     Cold Compress  Mucinex (Guaifenasin)     Tylenol (Regular or Extra  Sudafed/Sudafed-12 Hour     Strength)  **Sudafed PE Pseudoephedrine   Tylenol Cold & Sinus     Vicks Vapor Rub  Zyrtec  **AVOID if Problems With Blood Pressure         Heartburn: Avoid lying down for at least 1 hour after meals  Aciphex      Maalox     Rash:  Milk of Magnesia     Benadryl    Mylanta       1% Hydrocortisone Cream  Pepcid  Pepcid Complete   Sleep Aids:  Prevacid      Ambien   Prilosec       Benadryl  Rolaids       Chamomile Tea  Tums (Limit 4/day)     Unisom  Zantac       Tylenol PM         Warm milk-add vanilla or  Hemorrhoids:       Sugar for taste  Anusol/Anusol H.C.  (RX: Analapram 2.5%)  Sugar Substitutes:  Hydrocortisone OTC     Ok in moderation  Preparation H      Tucks        Vaseline lotion applied to tissue with wiping    Herpes:     Throat:  Acyclovir      Oragel  Famvir  Valtrex     Vaccines:         Flu Shot Leg Cramps:       *Gardasil  Benadryl      Hepatitis A  Hepatitis B Nasal Spray:       Pneumovax  Saline Nasal Spray     Polio Booster         Tetanus Nausea:       Tuberculosis test or PPD  Vitamin B6 25 mg TID   AVOID:    Dramamine      *Gardasil  Emetrol       Live Poliovirus  Ginger Root 250 mg QID    MMR (measles, mumps &  High Complex Carbs @ Bedtime    rebella)  Sea Bands-Accupressure    Varicella (Chickenpox)  Unisom 1/2 tab TID     *No known complications           If received before Pain:         Known pregnancy;   Darvocet       Resume series after  Lortab        Delivery  Percocet    Yeast:   Tramadol      Femstat  Tylenol  3      Gyne-lotrimin  Ultram       Monistat  Vicodin           MISC:         All Sunscreens           Hair Coloring/highlights          Insect Repellant's          (Including DEET)         Mystic Tans

## 2017-02-05 LAB — THYROID PANEL WITH TSH
FREE THYROXINE INDEX: 1.2 (ref 1.2–4.9)
T3 UPTAKE RATIO: 12 % — AB (ref 24–39)
T4, Total: 10 ug/dL (ref 4.5–12.0)
TSH: 3.36 u[IU]/mL (ref 0.450–4.500)

## 2017-02-05 LAB — HEMOGLOBIN AND HEMATOCRIT, BLOOD
Hematocrit: 35 % (ref 34.0–46.6)
Hemoglobin: 11.7 g/dL (ref 11.1–15.9)

## 2017-02-12 ENCOUNTER — Encounter: Payer: Self-pay | Admitting: Certified Nurse Midwife

## 2017-02-17 ENCOUNTER — Encounter: Payer: Self-pay | Admitting: Certified Nurse Midwife

## 2017-02-19 ENCOUNTER — Ambulatory Visit (INDEPENDENT_AMBULATORY_CARE_PROVIDER_SITE_OTHER): Payer: BLUE CROSS/BLUE SHIELD | Admitting: Obstetrics and Gynecology

## 2017-02-19 VITALS — BP 116/84 | HR 88 | Wt 167.9 lb

## 2017-02-19 DIAGNOSIS — L299 Pruritus, unspecified: Secondary | ICD-10-CM

## 2017-02-19 DIAGNOSIS — Z3493 Encounter for supervision of normal pregnancy, unspecified, third trimester: Secondary | ICD-10-CM

## 2017-02-19 LAB — POCT URINALYSIS DIPSTICK
Bilirubin, UA: NEGATIVE
Blood, UA: NEGATIVE
Ketones, UA: NEGATIVE
Nitrite, UA: NEGATIVE
Protein, UA: NEGATIVE
Spec Grav, UA: 1.015
Urobilinogen, UA: 0.2 U/dL
pH, UA: 7

## 2017-02-19 NOTE — Progress Notes (Signed)
ROB- pt states she has been itching all over, took benadryl, and claritin,having some pelvic pressure, states BS is doing better, pt is having some external hemorrhoids

## 2017-02-19 NOTE — Progress Notes (Signed)
ROB-itching helped with cool bath and claritin,  Labs obtained and will follow up accordingly.also has bilateral knee pain for two weeks, tried ice and heat with no help. FBS all <90 with a few elevated PP when forgot medication, growth scan at next visit.

## 2017-02-21 LAB — LIPASE: Lipase: 48 U/L (ref 14–72)

## 2017-02-21 LAB — URIC ACID: URIC ACID: 3.9 mg/dL (ref 2.5–7.1)

## 2017-02-21 LAB — AMYLASE: Amylase: 89 U/L (ref 31–124)

## 2017-02-21 LAB — BILE ACIDS, TOTAL: BILE ACIDS TOTAL: 7.5 umol/L (ref 4.7–24.5)

## 2017-02-21 LAB — VITAMIN D 25 HYDROXY (VIT D DEFICIENCY, FRACTURES): Vit D, 25-Hydroxy: 34.8 ng/mL (ref 30.0–100.0)

## 2017-02-22 ENCOUNTER — Telehealth: Payer: Self-pay | Admitting: Certified Nurse Midwife

## 2017-02-22 NOTE — Telephone Encounter (Signed)
Patient lvm to reschedule appointments on 03/08/2017, I" called patient back and patients stated that "she was not in front of her calendar and would have to give [place] a call back to reschedule her appointment. Patient did not disclose any other information. Thank you.

## 2017-03-01 ENCOUNTER — Encounter: Payer: Self-pay | Admitting: Certified Nurse Midwife

## 2017-03-01 ENCOUNTER — Other Ambulatory Visit: Payer: Self-pay | Admitting: Certified Nurse Midwife

## 2017-03-01 MED ORDER — HYDROCORTISONE ACETATE 25 MG RE SUPP: 25.0000 mg | Freq: Two times a day (BID) | RECTAL | 1 refills | Status: DC

## 2017-03-02 ENCOUNTER — Encounter: Payer: Self-pay | Admitting: Certified Nurse Midwife

## 2017-03-02 ENCOUNTER — Encounter: Payer: Self-pay | Admitting: Obstetrics and Gynecology

## 2017-03-02 ENCOUNTER — Telehealth: Payer: Self-pay | Admitting: Obstetrics and Gynecology

## 2017-03-02 ENCOUNTER — Ambulatory Visit (INDEPENDENT_AMBULATORY_CARE_PROVIDER_SITE_OTHER): Payer: BLUE CROSS/BLUE SHIELD | Admitting: Certified Nurse Midwife

## 2017-03-02 VITALS — BP 136/87 | HR 100 | Wt 169.6 lb

## 2017-03-02 DIAGNOSIS — O2243 Hemorrhoids in pregnancy, third trimester: Secondary | ICD-10-CM | POA: Diagnosis not present

## 2017-03-02 MED ORDER — LIDOCAINE HCL 2 % EX GEL: 1.0000 "application " | CUTANEOUS | 2 refills | Status: DC | PRN

## 2017-03-02 MED ORDER — BENZOCAINE-TRICLOSAN 20-0.13 % EX AERO
1.0000 | INHALATION_SPRAY | Freq: Three times a day (TID) | CUTANEOUS | 0 refills | Status: DC | PRN
Start: 2017-03-02 — End: 2017-03-08

## 2017-03-02 NOTE — Telephone Encounter (Signed)
Spoke with pt

## 2017-03-02 NOTE — Patient Instructions (Signed)
Nonsurgical Procedures for Hemorrhoids, Care After Refer to this sheet in the next few weeks. These instructions provide you with information about caring for yourself after your procedure. Your health care provider may also give you more specific instructions. Your treatment has been planned according to current medical practices, but problems sometimes occur. Call your health care provider if you have any problems or questions after your procedure. What can I expect after the procedure? After the procedure, it is common to have:  Slight rectal bleeding for a few days.  Soreness or a dull ache in the rectal area.  Follow these instructions at home: Medicines   Take over-the-counter and prescription medicines only as told by your health care provider.  Use a stool softener or a bulk laxative as told by your health care provider. Activity  Return to your normal activities as told by your health care provider. Ask your health care provider what activities are safe for you.  Do not lift anything that is heavier than 10 lb (4.5 kg).  Do not sit for long periods of time. Take a walk every day or as told by your health care provider.  Do not strain to have a bowel movement.  Do not spend a long time sitting on the toilet. Eating and drinking   Eat foods that contain fiber, such as whole grains, beans, nuts, fruits, and vegetables.  Drink enough fluid to keep your urine clear or pale yellow. General instructions  Sit in a warm bath 2-3 times a day to relieve soreness or itching.  Keep all follow-up visits as told by your health care provider. This is important. Contact a health care provider if:  Your pain medicine is not helping.  You have a fever.  You become constipated.  You continue to have light rectal bleeding for more than a few days. Get help right away if:  You have very bad rectal pain.  You have heavy bleeding from your rectum. This information is not intended  to replace advice given to you by your health care provider. Make sure you discuss any questions you have with your health care provider. Document Released: 09/13/2015 Document Revised: 01/23/2016 Document Reviewed: 11/12/2014 Elsevier Interactive Patient Education  [employer].

## 2017-03-02 NOTE — Progress Notes (Signed)
Subjective:   Holly Villa is a 31 y.o. G1P0 [redacted]w[redacted]d being seen today for problem visit.    Patient reports painful hemorrhoids since Friday, 02/26/2017. No relief with home treatment measures. Reports increased pain with walking that is not relieved by sitting or lying down. Last BM this morning.   Still taking Miralax and colace daily.   Denies contractions, vaginal bleeding or leaking of fluid.  Endorses good fetal movement.  The following portions of the patient's history were reviewed and updated as appropriate: allergies, current medications, past family history, past medical history, past social history, past surgical history and problem list.   Review of Systems:   ROS negative except as noted above. Information obtained from patient.   Objective:   BP 136/87   Pulse 100   Wt 169 lb 9.6 oz (76.9 kg)   LMP 05/31/2016 (Approximate) Comment: irregular  BMI 31.02 kg/m    FHT: Fetal Heart Rate (bpm): 145  Uterine Size: Fundal Height: 35 cm  Fetal Movement: Movement: Present    Abdomen:  soft, gravid, appropriate for gestational age,non-tender    Rectal:  external hemorrhoids noted, bulging, almond sized, five (5) o'clock.     Assessment:   Pregnancy:  G1P0 at [redacted]w[redacted]d  1. Hemorrhoids during pregnancy in third trimester  2. Incision and drainage of external hemorrhoid  Plan:   Incision and drainage of external hemorrhoid, see note below.   Discussed additional home treatment measures including frozen tucks pads.   Rx: Dermaplast and Lidocaine jelly, see orders.   Preterm labor symptoms: vaginal bleeding, contractions and leaking of fluid reviewed in detail.  Fetal movement precautions reviewed.  Follow up in 1 week as previously scheduled for Growth [place] and ROB or sooner if needed.   Gunnar Bulla, CNM   Procedure note  Holly Villa is a 31 y.o. year old Caucasian female here for incision and drainage of almond sized hemorrhoid.    Verbal consent  was obtained.   BP 136/87   Pulse 100   Wt 169 lb 9.6 oz (76.9 kg)   LMP 05/31/2016 (Approximate) Comment: irregular  BMI 31.02 kg/m   Appropriate time out taken. Hemorrhoid site identified.  Area prepped in usual sterile fashion and numbed with lodcaine jelly. One cc of 2% lidocaine was used to anesthetize the area. A small stab incision, less than one (1) cm was made. The hemorrhoid was milked, drained, and replaced in anus.  There was minimal blood loss. There were no complications. The patient tolerated the procedure well.  Advised against straining or bearing down for the next 24 hours. She was instructed to continue home treatment measures. Rx: lidocaine jelly and dermaplast, see orders.   Follow-up PRN problems.   Vanessa [place] Sema Stangler,CNM

## 2017-03-02 NOTE — Telephone Encounter (Signed)
Patient would like to speak with Holly Villa to discuss her follow up. Patient did not disclose any other information. Please advise.

## 2017-03-05 ENCOUNTER — Ambulatory Visit (INDEPENDENT_AMBULATORY_CARE_PROVIDER_SITE_OTHER): Payer: BLUE CROSS/BLUE SHIELD | Admitting: Obstetrics and Gynecology

## 2017-03-05 VITALS — BP 132/90 | HR 85 | Wt 173.5 lb

## 2017-03-05 DIAGNOSIS — Z3493 Encounter for supervision of normal pregnancy, unspecified, third trimester: Secondary | ICD-10-CM

## 2017-03-05 LAB — POCT URINALYSIS DIPSTICK
BILIRUBIN UA: NEGATIVE
KETONES UA: NEGATIVE
Leukocytes, UA: NEGATIVE
Nitrite, UA: NEGATIVE
Protein, UA: NEGATIVE
RBC UA: NEGATIVE
SPEC GRAV UA: 1.015 (ref 1.010–1.025)
UROBILINOGEN UA: 0.2 U/dL
pH, UA: 6 (ref 5.0–8.0)

## 2017-03-05 MED ORDER — HYDROCORTISONE ACE-PRAMOXINE 1-1 % RE FOAM: 1.0000 | Freq: Four times a day (QID) | RECTAL | 2 refills | Status: DC

## 2017-03-05 NOTE — Progress Notes (Signed)
Problem visit: hemorrhoid has returned and is bigger than before. I&D done again and switched to proctofoamHCL qid with tucks pads only. Will recheck at [facility] appointment

## 2017-03-05 NOTE — Progress Notes (Signed)
ROB- pt is here to have her hemorrhoid checked, states she is still in pain, however states the "area" feels harder now

## 2017-03-05 NOTE — Patient Instructions (Signed)
Hemorrhoids Hemorrhoids are swollen veins in and around the rectum or anus. There are two types of hemorrhoids:  Internal hemorrhoids. These occur in the veins that are just inside the rectum. They may poke through to the outside and become irritated and painful.  External hemorrhoids. These occur in the veins that are outside of the anus and can be felt as a painful swelling or hard lump near the anus.  Most hemorrhoids do not cause serious problems, and they can be managed with home treatments such as diet and lifestyle changes. If home treatments do not help your symptoms, procedures can be done to shrink or remove the hemorrhoids. What are the causes? This condition is caused by increased pressure in the anal area. This pressure may result from various things, including:  Constipation.  Straining to have a bowel movement.  Diarrhea.  Pregnancy.  Obesity.  Sitting for long periods of time.  Heavy lifting or other activity that causes you to strain.  Anal sex.  What are the signs or symptoms? Symptoms of this condition include:  Pain.  Anal itching or irritation.  Rectal bleeding.  Leakage of stool (feces).  Anal swelling.  One or more lumps around the anus.  How is this diagnosed? This condition can often be diagnosed through a visual exam. Other exams or tests may also be done, such as:  Examination of the rectal area with a gloved hand (digital rectal exam).  Examination of the anal canal using a small tube (anoscope).  A blood test, if you have lost a significant amount of blood.  A test to look inside the colon (sigmoidoscopy or colonoscopy).  How is this treated? This condition can usually be treated at home. However, various procedures may be done if dietary changes, lifestyle changes, and other home treatments do not help your symptoms. These procedures can help make the hemorrhoids smaller or remove them completely. Some of these procedures involve  surgery, and others do not. Common procedures include:  Rubber band ligation. Rubber bands are placed at the base of the hemorrhoids to cut off the blood supply to them.  Sclerotherapy. Medicine is injected into the hemorrhoids to shrink them.  Infrared coagulation. A type of light energy is used to get rid of the hemorrhoids.  Hemorrhoidectomy surgery. The hemorrhoids are surgically removed, and the veins that supply them are tied off.  Stapled hemorrhoidopexy surgery. A circular stapling device is used to remove the hemorrhoids and use staples to cut off the blood supply to them.  Follow these instructions at home: Eating and drinking  Eat foods that have a lot of fiber in them, such as whole grains, beans, nuts, fruits, and vegetables. Ask your health care provider about taking products that have added fiber (fiber supplements).  Drink enough fluid to keep your urine clear or pale yellow. Managing pain and swelling  Take warm sitz baths for 20 minutes, 3-4 times a day to ease pain and discomfort.  If directed, apply ice to the affected area. Using ice packs between sitz baths may be helpful. ? Put ice in a plastic bag. ? Place a towel between your skin and the bag. ? Leave the ice on for 20 minutes, 2-3 times a day. General instructions  Take over-the-counter and prescription medicines only as told by your health care provider.  Use medicated creams or suppositories as told.  Exercise regularly.  Go to the bathroom when you have the urge to have a bowel movement. Do not wait.    Avoid straining to have bowel movements.  Keep the anal area dry and clean. Use wet toilet paper or moist towelettes after a bowel movement.  Do not sit on the toilet for long periods of time. This increases blood pooling and pain. Contact a health care provider if:  You have increasing pain and swelling that are not controlled by treatment or medicine.  You have uncontrolled bleeding.  You  have difficulty having a bowel movement, or you are unable to have a bowel movement.  You have pain or inflammation outside the area of the hemorrhoids. This information is not intended to replace advice given to you by your health care provider. Make sure you discuss any questions you have with your health care provider. Document Released: 08/14/2000 Document Revised: 01/15/2016 Document Reviewed: 05/01/2015 Elsevier Interactive Patient Education  2017 [employer]  

## 2017-03-08 ENCOUNTER — Ambulatory Visit (INDEPENDENT_AMBULATORY_CARE_PROVIDER_SITE_OTHER): Payer: BLUE CROSS/BLUE SHIELD | Admitting: Certified Nurse Midwife

## 2017-03-08 ENCOUNTER — Encounter: Payer: Self-pay | Admitting: Certified Nurse Midwife

## 2017-03-08 ENCOUNTER — Ambulatory Visit (INDEPENDENT_AMBULATORY_CARE_PROVIDER_SITE_OTHER): Payer: BLUE CROSS/BLUE SHIELD

## 2017-03-08 VITALS — BP 128/85 | HR 84 | Wt 173.0 lb

## 2017-03-08 DIAGNOSIS — O403XX0 Polyhydramnios, third trimester, not applicable or unspecified: Secondary | ICD-10-CM | POA: Insufficient documentation

## 2017-03-08 DIAGNOSIS — Z3493 Encounter for supervision of normal pregnancy, unspecified, third trimester: Secondary | ICD-10-CM

## 2017-03-08 DIAGNOSIS — O24414 Gestational diabetes mellitus in pregnancy, insulin controlled: Secondary | ICD-10-CM | POA: Diagnosis not present

## 2017-03-08 LAB — POCT URINALYSIS DIPSTICK
Bilirubin, UA: NEGATIVE
Blood, UA: NEGATIVE
GLUCOSE UA: NEGATIVE
KETONES UA: NEGATIVE
Nitrite, UA: NEGATIVE
Spec Grav, UA: 1.015 (ref 1.010–1.025)
Urobilinogen, UA: 0.2 E.U./dL
pH, UA: 6 (ref 5.0–8.0)

## 2017-03-08 LAB — GLUCOSE, POCT (MANUAL RESULT ENTRY)
POC GLUCOSE: 58 mg/dL — AB (ref 70–99)
POC Glucose: 89 mg/dl (ref 70–99)

## 2017-03-08 NOTE — Patient Instructions (Signed)
Polyhydramnios When a woman becomes pregnant, a sac is formed around the fertilized egg (embryo) and later the growing baby (fetus). This sac is called the amniotic sac. The amniotic sac is filled with fluid. It gets bigger as the pregnancy grows. When there is too much fluid in the sac, it is called polyhydramnios. All babies born with polyhydramnios should be checked for congenital abnormalities. The amniotic fluid cushions and protects th Fetal Movement Counts Patient Name: ________________________________________________ Patient Due Date: ____________________ What is a fetal movement count? A fetal movement count is the number of times that you feel your baby move during a certain amount of time. This may also be called a fetal kick count. A fetal movement count is recommended for every pregnant woman. You may be asked to start counting fetal movements as early as week 28 of your pregnancy. Pay attention to when your baby is most active. You may notice your baby's sleep and wake cycles. You may also notice things that make your baby move more. You should do a fetal movement count: When your baby is normally most active. At the same time each day.  A good time to count movements is while you are resting, after having something to eat and drink. How do I count fetal movements? Find a quiet, comfortable area. Sit, or lie down on your side. Write down the date, the start time and stop time, and the number of movements that you felt between those two times. Take this information with you to your health care visits. For 2 hours, count kicks, flutters, swishes, rolls, and jabs. You should feel at least 10 movements during 2 hours. You may stop counting after you have felt 10 movements. If you do not feel 10 movements in 2 hours, have something to eat and drink. Then, keep resting and counting for 1 hour. If you feel at least 4 movements during that hour, you may stop counting. Contact a health care  provider if: You feel fewer than 4 movements in 2 hours. Your baby is not moving like he or she usually does. Date: ____________ Start time: ____________ Stop time: ____________ Movements: ____________ Date: ____________ Start time: ____________ Stop time: ____________ Movements: ____________ Date: ____________ Start time: ____________ Stop time: ____________ Movements: ____________ Date: ____________ Start time: ____________ Stop time: ____________ Movements: ____________ Date: ____________ Start time: ____________ Stop time: ____________ Movements: ____________ Date: ____________ Start time: ____________ Stop time: ____________ Movements: ____________ Date: ____________ Start time: ____________ Stop time: ____________ Movements: ____________ Date: ____________ Start time: ____________ Stop time: ____________ Movements: ____________ Date: ____________ Start time: ____________ Stop time: ____________ Movements: ____________ This information is not intended to replace advice given to you by your health care provider. Make sure you discuss any questions you have with your health care provider. Document Released: 09/16/2006 Document Revised: 04/15/2016 Document Reviewed: 09/26/2015 Elsevier Interactive Patient Education  [employer]. e baby. It also provides the baby with fluids and is crucial to normal development. Your baby breathes this fluid into its lungs and swallows it. This helps promote the healthy growth of the lungs and gastrointestinal tract. Amniotic fluid also helps the baby move around, helping with the normal development of muscle and bone. What are the causes? This condition is caused by:  Diabetes mellitus.  Down's syndrome, fetal abnormalities of the intestinal tract, and anencephaly (fetus without brain), all of which can prevent the fetus from swallowing the amniotic fluid.  One twins passes (transfuses) its blood into the other twin (twin-twin  transfusion  syndrome).  Medical illness of the mother, e.g., kidney or heart disease.  Tumor (chorioangioma) of the placenta.  What are the signs or symptoms? Symptoms of this condition include:  Enlarged uterus. The size of the uterus enlarges beyond what it should be for that particular time of the pregnancy.  Pressure and discomfort. The mother may feel more pressure and discomfort than should be expected.  Quick and unexpected enlargement of the mother's stomach.  How is this diagnosed? This condition is diagnosed when your health care provider measures you and notices that your uterus is beyond the size that is consistent with the time of the pregnancy.  An ultrasound is then used (abdominally or vaginally) to: ? See if you are carrying twins or more. ? Measure the growth of the baby. ? Look for birth defects. ? Measure the amount of fluid in the amniotic sac.  Amniotic Fluid Index (AFI) measures the amount of fluid in the amniotic sac in four different areas. If there is more than 24 centimeters, you have polyhydramnios. How is this treated? This condition may be treated by:  Removing some fluid from the amniotic sac.  Taking medications that lower the fluids in your body.  Stopping the use of salt or salty foods because it causes you to keep fluid in your body (retention).  Follow these instructions at home:  Keep all your prenatal visits as told by your health care provider. It is important to follow your health care provider's recommendations.  Do not eat a lot of salt and salty foods.  If you have diabetes, keep it under control.  If you have heart or kidney disease, treat the disease as told by your health care provider. Contact a health care provider if:  You think your uterus has grown too fast in a short period of time.  You feel a great amount of pressure in your lower belly (pelvis) and are more uncomfortable than expected. Get help right away if:  You have a  gush of fluid or are leaking fluid from your vagina.  You stop feeling the baby move.  You do not feel the baby kicking as much as usual.  You have a hard time keeping your diabetes under control.  You are having problems with your heart or kidney disease. This information is not intended to replace advice given to you by your health care provider. Make sure you discuss any questions you have with your health care provider. Document Released: 11/07/2002 Document Revised: 01/23/2016 Document Reviewed: 03/16/2013 Elsevier Interactive Patient Education  [employer].

## 2017-03-08 NOTE — Progress Notes (Signed)
ROB- no complaints.  

## 2017-03-08 NOTE — Progress Notes (Signed)
ROB-Pt reports less hemorrhoid discomfort since last office visit. Growth [place] today, fetal growth WNL; however AFI 29 cm or polyhydramnios. US findings reviewed with patient and spouse.   Blood sugar log reviewed, majority of PP 140-261 and majority of fasting <95. Hypoglycemic episode in office today. FSBG: 58, recheck: 89 after juice and snack.  Discussed risks associated with polyhydramnios and correlation with uncontrolled diabetes, pt verbalized understanding.   Discussed limitations of oral hyperglycemic agents and other management options.   US findings and blood sugar log reviewed with Dr. Valentino Saxon; pt transferred to MD service line. Pt aware of plan of care and agrees to service line transfer. Intrapartum midwifery support offered if desired by patient.   Urgent referral to Medical Nutrition Therapy. RTC this week for MD visit regarding Gestational Diabetes Management, pt to start insulin. RTC x 1 weeks for AFI/NST and ROB with MD.   ULTRASOUND REPORT  Location: [facility] Date of Service: 03/08/17  Indications: Growth and AFI for GDM Findings:  Singleton intrauterine pregnancy is visualized with FHR at 153 BPM. Biometrics give an (U/S) Gestational age of [redacted] weeks 6 days, and an (U/S) EDD of 04/27/17; this correlates with the clinically established EDD of 04/26/17.  Fetal presentation is vertex, spine left lateral.  EFW: 2264 grams ( 5 lbs. 0 oz) 54th percentile, [place]. Placenta: Anterior, grade 1. AFI: POLYHYDRAMNIOS at 29.0 cm ( 50th percentile for 33 weeks is: 14.3 cm, 97.5th percentile for 33 weeks: 27.4 cm.  Anatomic survey of the fetal stomach, bladder and kidneys appear WNL. Gender - Female.    Impression: 1. 32 week 6 day Viable Singleton Intrauterine pregnancy by U/S. 2. EFW: 2264 grams ( 5 lbs. 0 oz.) 54th percentile for growth per [place]ford Knife. 3. Polyhydramnios with an AFI of 29.0 cm which is greater than the 97.5 percentile for AFI at [redacted] weeks  gestation.  Recommendations: 1.Clinical correlation with the patient's History and Physical Exam.

## 2017-03-09 ENCOUNTER — Ambulatory Visit (INDEPENDENT_AMBULATORY_CARE_PROVIDER_SITE_OTHER): Payer: BLUE CROSS/BLUE SHIELD | Admitting: Obstetrics and Gynecology

## 2017-03-09 VITALS — BP 126/78 | HR 87 | Wt 173.1 lb

## 2017-03-09 DIAGNOSIS — O24414 Gestational diabetes mellitus in pregnancy, insulin controlled: Secondary | ICD-10-CM

## 2017-03-09 DIAGNOSIS — O0993 Supervision of high risk pregnancy, unspecified, third trimester: Secondary | ICD-10-CM

## 2017-03-09 DIAGNOSIS — O99280 Endocrine, nutritional and metabolic diseases complicating pregnancy, unspecified trimester: Secondary | ICD-10-CM

## 2017-03-09 DIAGNOSIS — O403XX0 Polyhydramnios, third trimester, not applicable or unspecified: Secondary | ICD-10-CM

## 2017-03-09 DIAGNOSIS — E079 Disorder of thyroid, unspecified: Secondary | ICD-10-CM

## 2017-03-09 MED ORDER — INSULIN NPH (HUMAN) (ISOPHANE) 100 UNIT/ML ~~LOC~~ SUSP: 16.0000 [IU] | Freq: Every day | SUBCUTANEOUS | 3 refills | Status: DC

## 2017-03-09 MED ORDER — INSULIN REGULAR HUMAN 100 UNIT/ML IJ SOLN: 16.0000 [IU] | Freq: Every day | INTRAMUSCULAR | 3 refills | Status: DC

## 2017-03-09 MED ORDER — INSULIN NPH (HUMAN) (ISOPHANE) 100 UNIT/ML ~~LOC~~ SUSP: 42.0000 [IU] | Freq: Every day | SUBCUTANEOUS | 3 refills | Status: DC

## 2017-03-09 MED ORDER — INSULIN REGULAR HUMAN 100 UNIT/ML IJ SOLN: 21.0000 [IU] | Freq: Every day | INTRAMUSCULAR | 3 refills | Status: DC

## 2017-03-09 NOTE — Progress Notes (Signed)
OB Consultation: Holly Villa is a 31 y.o. G1P0 female at 105w1d with GDM (currently uncontrolled on Glyburide), polyhydramnios (recent scan AFI 29 cm, performed yesterday) who presents for consultation for conversion to insulin regimen to better control her blood sugars.  Lengthy discussion had with patient regarding effects of uncontrolled DM pregnancy, including: respiratory complications, jaundice, difficulties regulating blood sugars and temperature, LGA/macrosomia requiring C-section, and postpartum hemorrhage.  Also discussed that large babies are more likely to experience birth trauma, including brachial plexus injury, during vaginal delivery. Large babies may need special care in a NICU. There also is an increased risk of stillbirth with GD.  In addition, patient also has polyhdramnios, which is likely due to her diabetes.  Discussed risks, including cord prolapse, placental abruption, premature birth and perinatal death. Discussed risks/benefits of insulin, and how this is gold standard for treatment of diabetes in pregnancy and should offer much better control. Calculated regimen based on patient's current weight and gestation is 21R/42N in a.m., and 16R/16N in p.m. Discussed technique for injection in [place].  Also has [facility] referral set for next week to review insulin administration.  Patient has been off glyburide since yesterday, can begin regimen this evening.  Patient to have f/u in 1 week to recheck blood sugars, and to repeat ultrasound for AFI and begin NSTs. Normal growth noted on last scan (54%ile). Cautioned regarding hypoglycemic episodes. All questions from patient and her mother were answered.  Patient is now risking out of midwifery care, and will be managed by MDs for remainder of her pregnancy.    A total of 25 minutes were spent face-to-face with the patient during this encounter and over half of that time involved counseling and coordination of care.   Hildred Laser,  MD [facility]

## 2017-03-09 NOTE — Patient Instructions (Addendum)
Calculated Initial Insulin Regimen  Breakfast : Rapid-acting or regular insulin 21 units [place] and NPH insulin 42 units [place] .  Dinner : Rapid-acting or regular insulin 16 units [place].  NPH insulin 16 units [place].

## 2017-03-10 ENCOUNTER — Telehealth: Payer: Self-pay | Admitting: Obstetrics and Gynecology

## 2017-03-10 NOTE — Telephone Encounter (Signed)
Patients Pharmacy [facility]) lvm and stated that they need some one to call and verify a prescription for the patient they are not able to fill the medication. Ally from the pharmacy did not disclose any other information. Please advise.

## 2017-03-10 NOTE — Telephone Encounter (Signed)
Called pharmacy [facility]) verified doses of insulin per the chart. Pharmacist gave verbal understanding.

## 2017-03-11 ENCOUNTER — Other Ambulatory Visit: Payer: Self-pay

## 2017-03-11 ENCOUNTER — Encounter: Payer: Self-pay | Admitting: Obstetrics and Gynecology

## 2017-03-11 DIAGNOSIS — O24414 Gestational diabetes mellitus in pregnancy, insulin controlled: Secondary | ICD-10-CM

## 2017-03-11 MED ORDER — "INSULIN SYRINGE 29G X 1/2"" 1 ML MISC": 3 refills | Status: DC

## 2017-03-12 ENCOUNTER — Other Ambulatory Visit: Payer: Self-pay

## 2017-03-12 DIAGNOSIS — O2243 Hemorrhoids in pregnancy, third trimester: Secondary | ICD-10-CM

## 2017-03-12 DIAGNOSIS — O24414 Gestational diabetes mellitus in pregnancy, insulin controlled: Secondary | ICD-10-CM

## 2017-03-12 MED ORDER — "INSULIN SYRINGE 29G X 1/2"" 1 ML MISC": 3 refills | Status: DC

## 2017-03-12 MED ORDER — HYDROCORTISONE ACE-PRAMOXINE 1-1 % RE FOAM: 1.0000 | Freq: Four times a day (QID) | RECTAL | 2 refills | Status: DC

## 2017-03-14 ENCOUNTER — Encounter: Payer: Self-pay | Admitting: Obstetrics and Gynecology

## 2017-03-16 ENCOUNTER — Other Ambulatory Visit: Payer: Self-pay | Admitting: Obstetrics and Gynecology

## 2017-03-16 DIAGNOSIS — O24414 Gestational diabetes mellitus in pregnancy, insulin controlled: Secondary | ICD-10-CM

## 2017-03-16 DIAGNOSIS — O403XX0 Polyhydramnios, third trimester, not applicable or unspecified: Secondary | ICD-10-CM

## 2017-03-17 ENCOUNTER — Ambulatory Visit (INDEPENDENT_AMBULATORY_CARE_PROVIDER_SITE_OTHER): Payer: BLUE CROSS/BLUE SHIELD | Admitting: Obstetrics and Gynecology

## 2017-03-17 ENCOUNTER — Encounter: Payer: BLUE CROSS/BLUE SHIELD | Attending: Certified Nurse Midwife | Admitting: *Deleted

## 2017-03-17 ENCOUNTER — Ambulatory Visit (INDEPENDENT_AMBULATORY_CARE_PROVIDER_SITE_OTHER): Payer: BLUE CROSS/BLUE SHIELD

## 2017-03-17 ENCOUNTER — Encounter: Payer: Self-pay | Admitting: *Deleted

## 2017-03-17 VITALS — BP 135/88 | HR 88 | Wt 175.7 lb

## 2017-03-17 VITALS — BP 112/80 | Ht 62.0 in | Wt 174.6 lb

## 2017-03-17 DIAGNOSIS — Z9119 Patient's noncompliance with other medical treatment and regimen: Secondary | ICD-10-CM

## 2017-03-17 DIAGNOSIS — Z3A00 Weeks of gestation of pregnancy not specified: Secondary | ICD-10-CM | POA: Insufficient documentation

## 2017-03-17 DIAGNOSIS — E079 Disorder of thyroid, unspecified: Secondary | ICD-10-CM

## 2017-03-17 DIAGNOSIS — O403XX0 Polyhydramnios, third trimester, not applicable or unspecified: Secondary | ICD-10-CM | POA: Diagnosis not present

## 2017-03-17 DIAGNOSIS — O0993 Supervision of high risk pregnancy, unspecified, third trimester: Secondary | ICD-10-CM

## 2017-03-17 DIAGNOSIS — O24414 Gestational diabetes mellitus in pregnancy, insulin controlled: Secondary | ICD-10-CM | POA: Insufficient documentation

## 2017-03-17 DIAGNOSIS — O99280 Endocrine, nutritional and metabolic diseases complicating pregnancy, unspecified trimester: Secondary | ICD-10-CM

## 2017-03-17 DIAGNOSIS — Z91199 Patient's noncompliance with other medical treatment and regimen due to unspecified reason: Secondary | ICD-10-CM

## 2017-03-17 DIAGNOSIS — O09893 Supervision of other high risk pregnancies, third trimester: Secondary | ICD-10-CM

## 2017-03-17 NOTE — Progress Notes (Signed)
ROB: Patient note complaints of pelvic pressure.  Notes Deberah Pelton. Denies vaginal bleeding, and reports active fetal movement. Had f/u with [facility] class for insulin controlled diabetes to review diet again this week.  Review of blood sugar log today notes fastings 70s-90s, postprandial breakfast 113-188 (with only 1 value elevated after eating pancakes), postprandial lunch 56-180s (with 1 value 167 after eating a sandwich and 1 elevated value of 180 after eating nachos); postprandial dinners 102-187 (with elevated values of 180 and 187 after eating pizza).  Discussed with patient that she would note better control of her values if she worked better at controlling her diet.  Patient notes she was just "testing her limits".  Advised that the time for testing was over as she has done this during the majority of her pregnancy and is now in the third trimester where her diabetes could impact the fetus the most. Strongly encouraged better compliance with diet.  Will not change insulin levels for now as patient needs to be more compliant with her diet. Reiterated management of hypoglycemia (noted twice last week 55 and 56, usually after lunch).  Patient notes she would high carb food to increase blood sugars.    Ultrasound today notes increase in amniotic fluid level ( AFI 35, increased from 29 cm last week).  Still notes that she is relatively asymptomatic.  Will refer to [facility] for co-management.  Patient will continue weekly NSTs and AFI checks.  Will need growth scan again at 36 weeks. If blood sugars still uncontrolled in next few weeks, discussed likelihood of earlier delivery (37-38 weeks), but will await MFM recommendations. Normal NST today (patient would prefer NST and AFI check on same day due to difficulty getting off from work).  Patient questions if she can travel in the next 1-2 weeks.  Advised that as she is a trisk for preterm labor and cord accidents due to polyhydramnios, I would not  recommend long-distance traveling. RTC in 1 week.

## 2017-03-17 NOTE — Patient Instructions (Addendum)
  Follow Gestational Meal Planning Guidelines Check blood sugars 4 x day - before breakfast and 2 hrs after every meal and record  Bring blood sugar log to MD appointments  Walk 20-30 minutes at least 5 x week if permitted by MD  Carry fast acting glucose and a snack at all times Rotate injection sites Give morning insulin 30 minutes before breakfast and evening insulin 30 minutes before supper  Morning insulin Humulin N (cloudy)  42  units Humulin R (clear)   21  units                                      63  units  Evening insulin Humulin N (cloudy) 16  units Humulin R (clear)  16  units    32 units

## 2017-03-17 NOTE — Progress Notes (Signed)
Diabetes Self-Management Education  Visit Type:  Follow-up  Appt. Start Time: 1000 Appt. End Time: 1050  03/17/2017  [name], identified by name and date of birth, is a 31 y.o. female with a diagnosis of Gestational Diabetes requiring insulin:  .    ASSESSMENT  Blood pressure 112/80, height 5\' 2"  (1.575 m), weight 174 lb 9.6 oz (79.2 kg), last menstrual period 05/31/2016. Body mass index is 31.93 kg/m.       Diabetes Self-Management Education - 03/17/17 1149      Psychosocial Assessment   Patient Concerns Glycemic Control   Special Needs None     Pre-Education Assessment   Patient understands the diabetes disease and treatment process. Demonstrates understanding / competency   Patient understands incorporating nutritional management into lifestyle. Needs Review   Patient undertands incorporating physical activity into lifestyle. Needs Review   Patient understands using medications safely. Needs Review   Patient understands monitoring blood glucose, interpreting and using results Needs Review   Patient understands prevention, detection, and treatment of acute complications. Needs Review   Patient understands prevention, detection, and treatment of chronic complications. Demonstrates understanding / competency   Patient understands how to develop strategies to address psychosocial issues. Needs Review   Patient understands how to develop strategies to promote health/change behavior. Needs Review     Complications   How often do you check your blood sugar? > 4 times/day   Fasting Blood glucose range (mg/dL) 19-147  FBG's since starting insulin 74-90 mg/dL   Postprandial Blood glucose range (mg/dL) pp's range from 82-956 mg/dL   Number of hypoglycemic episodes per month 2   Can you tell when your blood sugar is low? Yes - usually occurs when there is a delay between meals and snacks    What do you do if your blood sugar is low? has juice or eats something   Have you had  a dilated eye exam in the past 12 months? No   Have you had a dental exam in the past 12 months? No   Are you checking your feet? No     Dietary Intake   Breakfast eggs and bacon; pancakes; egg biscuit   Lunch tuna and salad; pizza; burger with no bread; tomato sandwich; nachos   Dinner chicken and veggies; salad; tomato sandwich; pizza; chicken and vegetables   Beverage(s) water     Exercise   Exercise Type ADL's     Patient Education   Previous Diabetes Education Yes (please comment)  Came to 2 classes for Gestational diabetes   Disease state  Explored patient's options for treatment of their diabetes   Nutrition management  Role of diet in the treatment of diabetes and the relationship between the three main macronutrients and blood glucose level;Reviewed blood glucose goals for pre and post meals and how to evaluate the patients' food intake on their blood glucose level.;Meal timing in regards to the patients' current diabetes medication.   Physical activity and exercise  Role of exercise on diabetes management, blood pressure control and cardiac health.   Medications Taught/reviewed insulin injection, site rotation, insulin storage and needle disposal.;Reviewed patients medication for diabetes, action, purpose, timing of dose and side effects. Pt's husband is giving insulin injections to pt's arms. He was not given any instruction except what he read from handout with insulin bottles and syringes.    Monitoring Taught/discussed recording of test results and interpretation of SMBG.   Acute complications Taught treatment of hypoglycemia - the 15 rule.  Chronic complications Relationship between chronic complications and blood glucose control   Psychosocial adjustment Identified and addressed patients feelings and concerns about diabetes   Preconception care Pregnancy and GDM  Role of pre-pregnancy blood glucose control on the development of the fetus;Reviewed with patient blood glucose  goals with pregnancy     Individualized Goals (developed by patient)   Reducing Risk Improve blood sugars     Outcomes   Program Status Completed     Subsequent Visit   Since your last visit have you continued or begun to take your medications as prescribed? Yes   Since your last visit have you had your blood pressure checked? Yes   Is your most recent blood pressure lower, unchanged, or higher since your last visit? Higher  Pregnancy   Since your last visit have you experienced any weight changes? Gain  Pregnancy   Weight Gain (lbs) 20   Since your last visit, are you checking your blood glucose at least once a day? Yes      Learning Objective:  Patient will have a greater understanding of diabetes self-management. Patient education plan is to attend individual and/or group sessions per assessed needs and concerns.   Plan:   Patient Instructions     Follow Gestational Meal Planning Guidelines Check blood sugars 4 x day - before breakfast and 2 hrs after every meal and record  Bring blood sugar log to MD appointments Walk 20-30 minutes at least 5 x week if permitted by MD Carry fast acting glucose and a snack at all times Rotate injection sites Give morning insulin 30 minutes before breakfast and evening insulin 30 minutes before supper  Morning insulin Humulin N (cloudy)  42  units Humulin R (clear)   21  units                                      63  units  Evening insulin Humulin N (cloudy) 16  units Humulin R (clear)  16  units    32 units   Expected Outcomes:  Demonstrated interest in learning. Expect positive outcomes  Education material provided: General Meal Planning  Getting Started with insulin booklet (BD) Mixing Insulin (BD) Glucose tablets Symptoms, causes and treatments of Hypoglycemia  If problems or questions, patient to contact team via:  Sharion Settler, RN, CCM, CDE [phone]  Future DSME appointment: - PRN

## 2017-03-17 NOTE — Progress Notes (Signed)
NONSTRESS TEST INTERPRETATION  INDICATIONS: GDM (on insulin), Polyhydramnios  FHR baseline: 130 bpm RESULTS:Reactive COMMENTS: 2 contractions noted (not detectable by patient)   PLAN: 1. Continue fetal kick counts twice a day. 2. Continue antepartum testing as scheduled-weekly   Debbe Bales, CMA

## 2017-03-18 ENCOUNTER — Other Ambulatory Visit: Payer: Self-pay | Admitting: Obstetrics and Gynecology

## 2017-03-18 DIAGNOSIS — O403XX0 Polyhydramnios, third trimester, not applicable or unspecified: Secondary | ICD-10-CM

## 2017-03-18 DIAGNOSIS — O24414 Gestational diabetes mellitus in pregnancy, insulin controlled: Secondary | ICD-10-CM

## 2017-03-22 ENCOUNTER — Other Ambulatory Visit: Payer: Self-pay | Admitting: *Deleted

## 2017-03-22 DIAGNOSIS — O403XX0 Polyhydramnios, third trimester, not applicable or unspecified: Secondary | ICD-10-CM

## 2017-03-23 ENCOUNTER — Encounter: Payer: Self-pay | Admitting: Obstetrics and Gynecology

## 2017-03-25 ENCOUNTER — Ambulatory Visit
Admission: RE | Admit: 2017-03-25 | Discharge: 2017-03-25 | Disposition: A | Discharge status: Home / Self Care | Payer: BLUE CROSS/BLUE SHIELD | Source: Ambulatory Visit | Attending: Maternal & Fetal Medicine | Admitting: Maternal & Fetal Medicine

## 2017-03-25 DIAGNOSIS — O403XX0 Polyhydramnios, third trimester, not applicable or unspecified: Secondary | ICD-10-CM | POA: Insufficient documentation

## 2017-03-25 DIAGNOSIS — Z3A35 35 weeks gestation of pregnancy: Secondary | ICD-10-CM | POA: Insufficient documentation

## 2017-03-26 ENCOUNTER — Ambulatory Visit (INDEPENDENT_AMBULATORY_CARE_PROVIDER_SITE_OTHER): Payer: BLUE CROSS/BLUE SHIELD

## 2017-03-26 ENCOUNTER — Ambulatory Visit: Payer: BLUE CROSS/BLUE SHIELD

## 2017-03-26 ENCOUNTER — Inpatient Hospital Stay
Admission: EM | Admit: 2017-03-26 | Discharge: 2017-03-28 | DRG: 775 | Disposition: A | Discharge status: Home / Self Care | Payer: BLUE CROSS/BLUE SHIELD | Attending: Obstetrics and Gynecology | Admitting: Obstetrics and Gynecology

## 2017-03-26 ENCOUNTER — Ambulatory Visit (INDEPENDENT_AMBULATORY_CARE_PROVIDER_SITE_OTHER): Payer: BLUE CROSS/BLUE SHIELD | Admitting: Obstetrics and Gynecology

## 2017-03-26 DIAGNOSIS — O24414 Gestational diabetes mellitus in pregnancy, insulin controlled: Secondary | ICD-10-CM

## 2017-03-26 DIAGNOSIS — K219 Gastro-esophageal reflux disease without esophagitis: Secondary | ICD-10-CM | POA: Diagnosis present

## 2017-03-26 DIAGNOSIS — O24424 Gestational diabetes mellitus in childbirth, insulin controlled: Secondary | ICD-10-CM | POA: Diagnosis present

## 2017-03-26 DIAGNOSIS — O2243 Hemorrhoids in pregnancy, third trimester: Secondary | ICD-10-CM | POA: Diagnosis present

## 2017-03-26 DIAGNOSIS — E079 Disorder of thyroid, unspecified: Secondary | ICD-10-CM | POA: Diagnosis present

## 2017-03-26 DIAGNOSIS — O403XX0 Polyhydramnios, third trimester, not applicable or unspecified: Secondary | ICD-10-CM

## 2017-03-26 DIAGNOSIS — Z3A35 35 weeks gestation of pregnancy: Secondary | ICD-10-CM

## 2017-03-26 DIAGNOSIS — O9962 Diseases of the digestive system complicating childbirth: Secondary | ICD-10-CM | POA: Diagnosis present

## 2017-03-26 DIAGNOSIS — O99284 Endocrine, nutritional and metabolic diseases complicating childbirth: Secondary | ICD-10-CM | POA: Diagnosis present

## 2017-03-26 LAB — CBC
HCT: 37.3 % (ref 35.0–47.0)
Hemoglobin: 12.6 g/dL (ref 12.0–16.0)
MCH: 29.1 pg (ref 26.0–34.0)
MCHC: 33.8 g/dL (ref 32.0–36.0)
MCV: 86 fL (ref 80.0–100.0)
PLATELETS: 392 10*3/uL (ref 150–440)
RBC: 4.34 MIL/uL (ref 3.80–5.20)
RDW: 13 % (ref 11.5–14.5)
WBC: 23.9 10*3/uL — AB (ref 3.6–11.0)

## 2017-03-26 LAB — COMPREHENSIVE METABOLIC PANEL
ALT: 24 U/L (ref 14–54)
ANION GAP: 10 (ref 5–15)
AST: 25 U/L (ref 15–41)
Albumin: 2.9 g/dL — ABNORMAL LOW (ref 3.5–5.0)
Alkaline Phosphatase: 219 U/L — ABNORMAL HIGH (ref 38–126)
BUN: 16 mg/dL (ref 6–20)
CHLORIDE: 106 mmol/L (ref 101–111)
CO2: 19 mmol/L — AB (ref 22–32)
Calcium: 9.4 mg/dL (ref 8.9–10.3)
Creatinine, Ser: 0.6 mg/dL (ref 0.44–1.00)
Glucose, Bld: 127 mg/dL — ABNORMAL HIGH (ref 65–99)
POTASSIUM: 3.8 mmol/L (ref 3.5–5.1)
SODIUM: 135 mmol/L (ref 135–145)
Total Bilirubin: 0.4 mg/dL (ref 0.3–1.2)
Total Protein: 7 g/dL (ref 6.5–8.1)

## 2017-03-26 LAB — TYPE AND SCREEN
ABO/RH(D): A POS
ANTIBODY SCREEN: NEGATIVE

## 2017-03-26 LAB — GLUCOSE, CAPILLARY
GLUCOSE-CAPILLARY: 137 mg/dL — AB (ref 65–99)
GLUCOSE-CAPILLARY: 184 mg/dL — AB (ref 65–99)

## 2017-03-26 MED ORDER — DIBUCAINE 1 % RE OINT
1.0000 "application " | TOPICAL_OINTMENT | RECTAL | Status: DC | PRN
  Administered 2017-03-28: 1 via RECTAL
  Filled 2017-03-26: qty 28

## 2017-03-26 MED ORDER — OXYCODONE-ACETAMINOPHEN 5-325 MG PO TABS
2.0000 | ORAL_TABLET | ORAL | Status: DC | PRN
Start: 2017-03-26 — End: 2017-03-28
  Administered 2017-03-27 (×3): 2 via ORAL
  Filled 2017-03-26 (×3): qty 2

## 2017-03-26 MED ORDER — ONDANSETRON HCL 4 MG/2ML IJ SOLN: 4.0000 mg | Freq: Four times a day (QID) | INTRAMUSCULAR | Status: DC | PRN

## 2017-03-26 MED ORDER — LIDOCAINE HCL (PF) 1 % IJ SOLN
30.0000 mL | INTRAMUSCULAR | Status: AC | PRN
  Administered 2017-03-26: 30 mL via SUBCUTANEOUS

## 2017-03-26 MED ORDER — COCONUT OIL OIL: 1.0000 "application " | TOPICAL_OIL | Status: DC | PRN

## 2017-03-26 MED ORDER — MISOPROSTOL 200 MCG PO TABS
ORAL_TABLET | ORAL | Status: AC
  Filled 2017-03-26: qty 4

## 2017-03-26 MED ORDER — BUTORPHANOL TARTRATE 1 MG/ML IJ SOLN: 1.0000 mg | Freq: Once | INTRAMUSCULAR | Status: DC

## 2017-03-26 MED ORDER — TETANUS-DIPHTH-ACELL PERTUSSIS 5-2.5-18.5 LF-MCG/0.5 IM SUSP
0.5000 mL | Freq: Once | INTRAMUSCULAR | Status: AC
  Administered 2017-03-28: 0.5 mL via INTRAMUSCULAR
  Filled 2017-03-26: qty 0.5

## 2017-03-26 MED ORDER — OXYCODONE-ACETAMINOPHEN 5-325 MG PO TABS: 1.0000 | ORAL_TABLET | ORAL | Status: DC | PRN

## 2017-03-26 MED ORDER — BUTORPHANOL TARTRATE 1 MG/ML IJ SOLN
1.0000 mg | INTRAMUSCULAR | Status: DC | PRN
  Administered 2017-03-26: 1 mg via INTRAVENOUS
  Filled 2017-03-26 (×2): qty 1

## 2017-03-26 MED ORDER — OXYTOCIN BOLUS FROM INFUSION
500.0000 mL | Freq: Once | INTRAVENOUS | Status: AC
  Administered 2017-03-26: 500 mL via INTRAVENOUS

## 2017-03-26 MED ORDER — ACETAMINOPHEN 325 MG PO TABS: 650.0000 mg | ORAL_TABLET | ORAL | Status: DC | PRN

## 2017-03-26 MED ORDER — SOD CITRATE-CITRIC ACID 500-334 MG/5ML PO SOLN: 30.0000 mL | ORAL | Status: DC | PRN

## 2017-03-26 MED ORDER — PRENATAL MULTIVITAMIN CH
1.0000 | ORAL_TABLET | Freq: Every day | ORAL | Status: DC
  Administered 2017-03-27 – 2017-03-28 (×2): 1 via ORAL
  Filled 2017-03-26 (×2): qty 1

## 2017-03-26 MED ORDER — SODIUM CHLORIDE 0.9 % IV SOLN
INTRAVENOUS | Status: AC
  Filled 2017-03-26: qty 2000

## 2017-03-26 MED ORDER — DOCUSATE SODIUM 100 MG PO CAPS
100.0000 mg | ORAL_CAPSULE | Freq: Two times a day (BID) | ORAL | Status: DC
  Administered 2017-03-26 – 2017-03-28 (×4): 100 mg via ORAL
  Filled 2017-03-26 (×4): qty 1

## 2017-03-26 MED ORDER — OXYCODONE-ACETAMINOPHEN 5-325 MG PO TABS
1.0000 | ORAL_TABLET | ORAL | Status: DC | PRN
  Administered 2017-03-26 – 2017-03-28 (×3): 1 via ORAL
  Filled 2017-03-26 (×3): qty 1

## 2017-03-26 MED ORDER — LIDOCAINE HCL (PF) 1 % IJ SOLN
INTRAMUSCULAR | Status: AC
  Filled 2017-03-26: qty 30

## 2017-03-26 MED ORDER — ONDANSETRON HCL 4 MG PO TABS: 4.0000 mg | ORAL_TABLET | ORAL | Status: DC | PRN

## 2017-03-26 MED ORDER — AMMONIA AROMATIC IN INHA
RESPIRATORY_TRACT | Status: AC
  Filled 2017-03-26: qty 10

## 2017-03-26 MED ORDER — FLEET ENEMA 7-19 GM/118ML RE ENEM: 1.0000 | ENEMA | Freq: Every day | RECTAL | Status: DC | PRN

## 2017-03-26 MED ORDER — OXYTOCIN 40 UNITS IN LACTATED RINGERS INFUSION - SIMPLE MED
2.5000 [IU]/h | INTRAVENOUS | Status: DC | PRN
  Filled 2017-03-26: qty 1000

## 2017-03-26 MED ORDER — OXYCODONE-ACETAMINOPHEN 5-325 MG PO TABS: 2.0000 | ORAL_TABLET | ORAL | Status: DC | PRN

## 2017-03-26 MED ORDER — ZOLPIDEM TARTRATE 5 MG PO TABS: 5.0000 mg | ORAL_TABLET | Freq: Every evening | ORAL | Status: DC | PRN

## 2017-03-26 MED ORDER — SODIUM CHLORIDE 0.9 % IV SOLN
2.0000 g | Freq: Once | INTRAVENOUS | Status: AC
  Administered 2017-03-26: 2 g via INTRAVENOUS

## 2017-03-26 MED ORDER — WITCH HAZEL-GLYCERIN EX PADS
1.0000 "application " | MEDICATED_PAD | CUTANEOUS | Status: DC | PRN
  Administered 2017-03-28: 1 via TOPICAL
  Filled 2017-03-26: qty 100

## 2017-03-26 MED ORDER — DIPHENHYDRAMINE HCL 25 MG PO CAPS: 25.0000 mg | ORAL_CAPSULE | Freq: Four times a day (QID) | ORAL | Status: DC | PRN

## 2017-03-26 MED ORDER — SIMETHICONE 80 MG PO CHEW
80.0000 mg | CHEWABLE_TABLET | ORAL | Status: DC | PRN
  Administered 2017-03-26: 80 mg via ORAL
  Filled 2017-03-26: qty 1

## 2017-03-26 MED ORDER — BENZOCAINE-MENTHOL 20-0.5 % EX AERO
1.0000 "application " | INHALATION_SPRAY | CUTANEOUS | Status: DC | PRN
  Filled 2017-03-26: qty 56

## 2017-03-26 MED ORDER — GLYBURIDE 5 MG PO TABS
5.0000 mg | ORAL_TABLET | Freq: Two times a day (BID) | ORAL | Status: DC
  Administered 2017-03-26: 5 mg via ORAL
  Filled 2017-03-26 (×2): qty 1

## 2017-03-26 MED ORDER — OXYTOCIN 40 UNITS IN LACTATED RINGERS INFUSION - SIMPLE MED
2.5000 [IU]/h | INTRAVENOUS | Status: DC
  Filled 2017-03-26: qty 1000

## 2017-03-26 MED ORDER — IBUPROFEN 600 MG PO TABS
600.0000 mg | ORAL_TABLET | Freq: Four times a day (QID) | ORAL | Status: DC
  Administered 2017-03-26 – 2017-03-28 (×9): 600 mg via ORAL
  Filled 2017-03-26 (×9): qty 1

## 2017-03-26 MED ORDER — LACTATED RINGERS IV SOLN: 500.0000 mL | INTRAVENOUS | Status: DC | PRN

## 2017-03-26 MED ORDER — ONDANSETRON HCL 4 MG/2ML IJ SOLN: 4.0000 mg | INTRAMUSCULAR | Status: DC | PRN

## 2017-03-26 MED ORDER — LACTATED RINGERS IV SOLN
INTRAVENOUS | Status: DC
  Administered 2017-03-26: 11:00:00 via INTRAVENOUS

## 2017-03-26 MED ORDER — OXYTOCIN 10 UNIT/ML IJ SOLN
INTRAMUSCULAR | Status: AC
  Filled 2017-03-26: qty 2

## 2017-03-26 NOTE — Progress Notes (Signed)
Dr Logan Bores informed of sugar of 184 obtained. Patient had just ate a cheeseburger and a chicken sandwich.  Also glyburide wasn't given til within the last hour. RN verified the fact that there are two orders in computer one for every 4 hours and one for four times a day. MD stated that it is ok to discontinue the order for every 4 hours and only do blood sugars 4x day prior to meals and at bedtime.

## 2017-03-26 NOTE — Progress Notes (Signed)
NONSTRESS TEST INTERPRETATION Pt reports since 3AM she has had LLQ discomfort. Unable to lay down for too long prefers to stand. NST done and reviewed by A. Janee Morn CNM. Unable to due BPP due to discomfort. Provider sent pt to L&D.NONSTRESS TEST   INDICATIONS: hydramnios, diabetes FHR baseline: 135 RESULTS:  reactive COMMENTS:   PLAN: 1.Pt sent to L&D for labor  Elonda Husky, M.D. 03/29/2017 10:56 AM

## 2017-03-26 NOTE — Lactation Note (Signed)
This note was copied from a baby's chart. Lactation Consultation Note  Patient Name: Boy Augustine Stilson MEBRA'X Date: 03/26/2017 Reason for consult: Follow-up assessment   Maternal Data  Breasts are firm, small nipples , difficult to compress tissue  Feeding Feeding Type: Formula Nipple Type: Slow - flow (did not suck well or eat well)  LATCH Score/Interventions   Baby has been sleepy and has had a few sucks at breast but tires quickly, breasts are firm and baby not haspoor suck so unable to latch well, no sustained sucking, Symphony breastpump set up in room if baby continues to not latch and nurse                   Lactation Tools Discussed/Used Tools: 48F feeding tube / Syringe Initiated by::  (set up in room no instruction at present visitors in room)   Consult Status Consult Status: Follow-up Date: 03/27/17 Follow-up type: In-patient    Dyann Kief 03/26/2017, 8:57 PM

## 2017-03-26 NOTE — Plan of Care (Signed)
Problem: Nutritional: Goal: Dietary intake will improve Outcome: Progressing Tolerating diet   Problem: Role Relationship: Goal: Ability to demonstrate positive interaction with newborn will improve Outcome: Progressing Mo asking for instructions and help taking care of baby

## 2017-03-26 NOTE — Progress Notes (Signed)
Pt presents for NST, Dr. Logan Bores in surgery. PT complains of right lower quadrant pain, constant. Abdomen palpates soft. NST Denies LOF, vaginal bleeding and marks fetal movement on NST strip.   Reactive NST, catergory 1 strip. Baseline 135, accelerations present. No decelerations. Moderate variability. Toco irritability. BPP 0/10. Pt sent to L&D for further evaluation and management by Dr. Logan Bores. Deferred SVE due to pt going to L&D .   Doreene Burke, CNM

## 2017-03-26 NOTE — H&P (Signed)
History and Physical   HPI  Holly Villa is a 31 y.o. G1P0 at [redacted]w[redacted]d Estimated Date of Delivery: 04/26/17 who is being admitted for labor management.  Pt had a BPP and NST today in the office.  0/8 BPP (hydramnios) and Reactive NST. She was having irregular contractions.   Transferred to L&D for labor management.  OB History  Obstetric History   G1   P0   T0   P0   A0   L0    SAB0   TAB0   Ectopic0   Multiple0   Live Births0     # Outcome Date GA Lbr Len/2nd Weight Sex Delivery Anes PTL Lv  1 Current               PROBLEM LIST  Pregnancy complications or risks: Patient Active Problem List   Diagnosis Date Noted  . Preterm labor 03/26/2017  . Polyhydramnios in third trimester 03/08/2017  . Hemorrhoids during pregnancy in third trimester 03/02/2017  . Gestational diabetes 12/15/2016  . Thyroid disease affecting pregnancy 12/12/2016  . PCOS (polycystic ovarian syndrome)   . Functional constipation 11/16/2013  . IBS (irritable bowel syndrome) 11/16/2013    Prenatal labs and studies: ABO, Rh: --/Positive/-- 10-24-22 1119) Antibody: Negative 2022/10/24 1110) Rubella: 2.55 2022-10-24 1110) RPR: Non Reactive 2022-10-24 1110)  HBsAg: Negative 2022-10-24 1110)  HIV:    GBS: [place]   Past Medical History:  Diagnosis Date  . Amenorrhea   . GERD (gastroesophageal reflux disease)   . Gestational diabetes   . PCOS (polycystic ovarian syndrome)    new diagnosis per pt.   . Thyroid disease      Past Surgical History:  Procedure Laterality Date  . none       Medications    Current Discharge Medication List    CONTINUE these medications which have NOT CHANGED   Details  diphenhydrAMINE (BENADRYL) 25 mg capsule Take 25 mg by mouth every 6 (six) hours as needed.    docusate sodium (COLACE) 100 MG capsule Take 200 mg by mouth daily.    glucose blood (BAYER CONTOUR NEXT TEST) test strip Use four times daily as directed Qty: 100 each, Refills: 12   Associated Diagnoses: Diet  controlled gestational diabetes mellitus (GDM) in second trimester    hydrocortisone-pramoxine (PROCTOFOAM HC) rectal foam Place 1 applicator rectally 4 (four) times daily. Qty: 10 g, Refills: 2   Associated Diagnoses: Hemorrhoids during pregnancy in third trimester    !! insulin NPH Human (HUMULIN N,NOVOLIN N) 100 UNIT/ML injection Inject 0.42 mLs (42 Units total) into the skin daily before breakfast. Qty: 10 mL, Refills: 3    !! insulin NPH Human (HUMULIN N,NOVOLIN N) 100 UNIT/ML injection Inject 0.16 mLs (16 Units total) into the skin daily before supper. Qty: 10 mL, Refills: 3    !! insulin regular (HUMULIN R) 100 units/mL injection Inject 0.21 mLs (21 Units total) into the skin daily before breakfast. Qty: 10 mL, Refills: 3    !! insulin regular (HUMULIN R) 100 units/mL injection Inject 0.16 mLs (16 Units total) into the skin daily before supper. Qty: 10 mL, Refills: 3    INSULIN SYRINGE 1CC/29G (B-D INS SYR ULTRAFINE 1CC/29G) 29G X 1/2" 1 ML MISC Use 2-4 times daily as directed Qty: 100 each, Refills: 3   Associated Diagnoses: Insulin controlled gestational diabetes mellitus (GDM) in third trimester    Lancets MISC 1 each by Does not apply route 4 (four) times daily. Qty: 120 each, Refills: 6  levothyroxine (SYNTHROID, LEVOTHROID) 25 MCG tablet Take 1 tablet (25 mcg total) by mouth daily before breakfast. Qty: 30 tablet, Refills: 3   Associated Diagnoses: Thyroid disease    magnesium 30 MG tablet Take 30 mg by mouth daily.    polyethylene glycol (MIRALAX / GLYCOLAX) packet Take 17 g by mouth daily.    Prenat-FeFmCb-DSS-FA-DHA w/o A (CITRANATAL HARMONY) 27-1-260 MG CAPS Take 1 capsule by mouth daily. Qty: 30 capsule, Refills: 6    ranitidine (ZANTAC) 75 MG tablet Take 75 mg by mouth daily.     !! - Potential duplicate medications found. Please discuss with provider.       Allergies  Patient has no known allergies.  Review of Systems  Pertinent items are noted  in HPI.  Physical Exam  Ht 5\' 3"  (1.6 m)   Wt 174 lb (78.9 kg)   LMP 05/31/2016 (Approximate) Comment: irregular  BMI 30.82 kg/m   Lungs:  CTA B Cardio: RRR without M/R/G Abd: Soft, gravid, NT Presentation: cephalic EXT: No C/C/ 1+ Edema DTRs: 2+ B CERVIX: 7cm:90%: -2: mid position: soft  See Prenatal records for more detailed PE.  FHR:  Variability: Good {> 6 bpm)  Toco: Uterine Contractions: Q 2 min   Test Results  Results for orders placed or performed during the hospital encounter of 03/26/17 (from the past 24 hour(s))  CBC     Status: Abnormal   Collection Time: 03/26/17 10:40 AM  Result Value Ref Range   WBC 23.9 (H) 3.6 - 11.0 K/uL   RBC 4.34 3.80 - 5.20 MIL/uL   Hemoglobin 12.6 12.0 - 16.0 g/dL   HCT 46.9 62.9 - 52.8 %   MCV 86.0 80.0 - 100.0 fL   MCH 29.1 26.0 - 34.0 pg   MCHC 33.8 32.0 - 36.0 g/dL   RDW 41.3 24.4 - 01.0 %   Platelets 392 150 - 440 K/uL  Comprehensive metabolic panel     Status: Abnormal   Collection Time: 03/26/17 10:40 AM  Result Value Ref Range   Sodium 135 135 - 145 mmol/L   Potassium 3.8 3.5 - 5.1 mmol/L   Chloride 106 101 - 111 mmol/L   CO2 19 (L) 22 - 32 mmol/L   Glucose, Bld 127 (H) 65 - 99 mg/dL   BUN 16 6 - 20 mg/dL   Creatinine, Ser 2.72 0.44 - 1.00 mg/dL   Calcium 9.4 8.9 - 53.6 mg/dL   Total Protein 7.0 6.5 - 8.1 g/dL   Albumin 2.9 (L) 3.5 - 5.0 g/dL   AST 25 15 - 41 U/L   ALT 24 14 - 54 U/L   Alkaline Phosphatase 219 (H) 38 - 126 U/L   Total Bilirubin 0.4 0.3 - 1.2 mg/dL   GFR calc non Af Amer >60 >60 mL/min   GFR calc Af Amer >60 >60 mL/min   Anion gap 10 5 - 15   Assessment   G1P0 at [redacted]w[redacted]d Estimated Date of Delivery: 04/26/17  The fetus is reassuring.   Patient Active Problem List   Diagnosis Date Noted  . Preterm labor 03/26/2017  . Polyhydramnios in third trimester 03/08/2017  . Hemorrhoids during pregnancy in third trimester 03/02/2017  . Gestational diabetes 12/15/2016  . Thyroid disease affecting  pregnancy 12/12/2016  . PCOS (polycystic ovarian syndrome)   . Functional constipation 11/16/2013  . IBS (irritable bowel syndrome) 11/16/2013    Plan  1. Admit to L&D for anticipate vaginal delivery 2. EFM: -- Category 1 3. Epidural if desired.  Stadol for IV pain until epidural requested. 4. Admission labs  5. AROM - head was very well applied clear fluid.  Elonda Husky, M.D. 03/26/2017 11:44 AM

## 2017-03-27 LAB — GLUCOSE, CAPILLARY
GLUCOSE-CAPILLARY: 159 mg/dL — AB (ref 65–99)
GLUCOSE-CAPILLARY: 139 mg/dL — AB (ref 65–99)
GLUCOSE-CAPILLARY: 160 mg/dL — AB (ref 65–99)
Glucose-Capillary: 49 mg/dL — ABNORMAL LOW (ref 65–99)
Glucose-Capillary: 193 mg/dL — ABNORMAL HIGH (ref 65–99)

## 2017-03-27 LAB — RPR: RPR Ser Ql: NONREACTIVE

## 2017-03-27 NOTE — Progress Notes (Signed)
Patient ID: Holly Villa, female   DOB: [birth year], 31 y.o.   MRN: [id number]   Progress Note - Vaginal Delivery  Holly Villa is a 31 y.o. G1P0 now PP day 1 s/p Vaginal, Spontaneous Delivery .   Subjective:  The patient reports no complaints, voiding and tolerating PO  Objective:  Vital signs in last 24 hours: Temp:  [98.1 F (36.7 C)-99 F (37.2 C)] 98.6 F (37 C) (07/28 0741) Pulse Rate:  [88-148] 88 (07/28 0800) Resp:  [18-20] 18 (07/28 0741) BP: (122-146)/(74-103) 122/74 (07/28 0741) SpO2:  [82 %-100 %] 100 % (07/28 0741) Weight:  [174 lb (78.9 kg)] 174 lb (78.9 kg) (07/27 1100)  Physical Exam:  General: alert, cooperative and no distress Lochia: appropriate Uterine Fundus: firm DVT Evaluation: No evidence of DVT seen on physical exam.    Data Review  Recent Labs  03/26/17 1040  HGB 12.6  HCT 37.3    Assessment/Plan: Active Problems:   Preterm labor   Plan for discharge tomorrow  -- Continue routine PP care.     Elonda Husky, M.D. 03/27/2017 10:28 AM

## 2017-03-27 NOTE — Progress Notes (Signed)
salined locked iv, bleeding wnl, not dizzy when up to bathroom and tolerating po

## 2017-03-28 LAB — GLUCOSE, CAPILLARY
GLUCOSE-CAPILLARY: 85 mg/dL (ref 65–99)
Glucose-Capillary: 86 mg/dL (ref 65–99)

## 2017-03-28 MED ORDER — WHITE PETROLATUM GEL
Status: AC
  Filled 2017-03-28: qty 5

## 2017-03-28 NOTE — Discharge Instructions (Signed)

## 2017-03-28 NOTE — Discharge Summary (Signed)
Discharge Summary  Date of Admission: 03/26/2017  Date of Discharge:   Admitting Diagnosis: Onset of Labor at [redacted]w[redacted]d  Secondary Diagnosis: Type II Diabetes mellitus  Mode of Delivery: normal spontaneous vaginal delivery       Discharge Diagnosis: No other diagnosis   Intrapartum Procedures: Atificial rupture of membranes   Post partum procedures:   Complications: none                      Discharge Day SOAP Note:  Progress Note - Vaginal Delivery  Holly Villa is a 31 y.o. G1P0 now PP day 2 s/p Vaginal, Spontaneous Delivery . Delivery was complicated by diabetes  Subjective  The patient has the following complaints: has no unusual complaints  Pain is controlled with current medications.   Patient is urinating without difficulty.  She is ambulating well.    Objective  Vital signs: BP 118/87 (BP Location: Left Arm)   Pulse 91   Temp 98.2 F (36.8 C) (Oral)   Resp 20   Ht 5\' 3"  (1.6 m)   Wt 174 lb (78.9 kg)   LMP 05/31/2016 (Approximate) Comment: irregular  SpO2 99%   BMI 30.82 kg/m   Physical Exam: Gen: NAD Fundus Fundal Tone: Firm  Lochia Amount: Small  Perineum Appearance: Edematous, Intact, Approximated     Data Review Labs: CBC Latest Ref Rng & Units 03/26/2017 02/04/2017 10/05/2016  WBC 3.6 - 11.0 K/uL 23.9(H) - 12.9(H)  Hemoglobin 12.0 - 16.0 g/dL 45.4 09.8 11.9  Hematocrit 35.0 - 47.0 % 37.3 35.0 41.2  Platelets 150 - 440 K/uL 392 - 453(H)   A POS  Assessment/Plan  Active Problems:   Preterm labor    Plan for discharge today.   Discharge Instructions: Per After Visit Summary. Activity: Advance as tolerated. Pelvic rest for 6 weeks.  Also refer to After Visit Summary Diet: Regular Medications: Allergies as of 03/28/2017   No Known Allergies     Medication List    STOP taking these medications   BENADRYL 25 mg capsule Generic drug:  diphenhydrAMINE   glucose blood test strip Commonly known as:  BAYER  CONTOUR NEXT TEST   hydrocortisone-pramoxine rectal foam Commonly known as:  PROCTOFOAM HC   insulin NPH Human 100 UNIT/ML injection Commonly known as:  HUMULIN N,NOVOLIN N   insulin regular 100 units/mL injection Commonly known as:  HUMULIN R   INSULIN SYRINGE 1CC/29G 29G X 1/2" 1 ML Misc Commonly known as:  B-D INS SYR ULTRAFINE 1CC/29G   Lancets Misc     TAKE these medications   CITRANATAL HARMONY 27-1-260 MG Caps Take 1 capsule by mouth daily.   docusate sodium 100 MG capsule Commonly known as:  COLACE Take 200 mg by mouth daily.   levothyroxine 25 MCG tablet Commonly known as:  SYNTHROID, LEVOTHROID Take 1 tablet (25 mcg total) by mouth daily before breakfast.   magnesium 30 MG tablet Take 30 mg by mouth daily.   polyethylene glycol packet Commonly known as:  MIRALAX / GLYCOLAX Take 17 g by mouth daily.   ranitidine 75 MG tablet Commonly known as:  ZANTAC Take 75 mg by mouth daily.      Outpatient follow up:  Follow-up Information    Linzie Collin, MD Follow up in 1 week(s).   Specialty:  Obstetrics  and Gynecology Contact information: [address] [place] [address] [phone]          Postpartum contraception: discuss at 1 week check  Discharged Condition: good  Discharged to: home  Newborn Data: Disposition:home with mother  Apgars: APGAR (1 MIN): 7   APGAR (5 MINS): 8   APGAR (10 MINS):    Baby Feeding: Breast    Elonda Husky, M.D. 03/28/2017 10:11 AM

## 2017-03-28 NOTE — Progress Notes (Signed)
Pt wheeled out by auxiliary with infant.

## 2017-03-28 NOTE — Progress Notes (Signed)
Patient and family watched the CPR video, as well as Period of Purple Cry.

## 2017-03-28 NOTE — Progress Notes (Signed)
Patient discharged home with infant. Discharge instructions, prescriptions and follow up appointment given to and reviewed with patient. Patient verbalized understanding  

## 2017-04-02 ENCOUNTER — Ambulatory Visit (INDEPENDENT_AMBULATORY_CARE_PROVIDER_SITE_OTHER): Payer: BLUE CROSS/BLUE SHIELD | Admitting: Obstetrics and Gynecology

## 2017-04-02 ENCOUNTER — Encounter: Payer: Self-pay | Admitting: Obstetrics and Gynecology

## 2017-04-02 NOTE — Progress Notes (Signed)
HPI:      [name] is a 31 y.o. G1P0 who LMP was No LMP recorded.  Subjective:   She presents today 1 week postpartum. She is doing well. She is breast-feeding full-time without problem. She has been keeping track of her sugars and although a few of her fastings are slightly high there mostly very good. She is on no medication for diabetes at this time. She is requesting IUD for birth control.    Hx: The following portions of the patient's history were reviewed and updated as appropriate:             She  has a past medical history of Amenorrhea; GERD (gastroesophageal reflux disease); Gestational diabetes; PCOS (polycystic ovarian syndrome); and Thyroid disease. She  does not have any pertinent problems on file. She  has a past surgical history that includes none. Her family history includes Diabetes in her maternal grandfather; Heart failure in her father; Stroke in her maternal grandmother; Thyroid disease in her maternal grandmother and mother. She  reports that she has been smoking Cigarettes.  She has been smoking about 0.10 packs per day. She has never used smokeless tobacco. She reports that she does not drink alcohol or use drugs. She has No Known Allergies.       Review of Systems:  Review of Systems  Constitutional: Denied constitutional symptoms, night sweats, recent illness, fatigue, fever, insomnia and weight loss.  Eyes: Denied eye symptoms, eye pain, photophobia, vision change and visual disturbance.  Ears/Nose/Throat/Neck: Denied ear, nose, throat or neck symptoms, hearing loss, nasal discharge, sinus congestion and sore throat.  Cardiovascular: Denied cardiovascular symptoms, arrhythmia, chest pain/pressure, edema, exercise intolerance, orthopnea and palpitations.  Respiratory: Denied pulmonary symptoms, asthma, pleuritic pain, productive sputum, cough, dyspnea and wheezing.  Gastrointestinal: Denied, gastro-esophageal reflux, melena, nausea and vomiting.   Genitourinary: Denied genitourinary symptoms including symptomatic vaginal discharge, pelvic relaxation issues, and urinary complaints.  Musculoskeletal: Denied musculoskeletal symptoms, stiffness, swelling, muscle weakness and myalgia.  Dermatologic: Denied dermatology symptoms, rash and scar.  Neurologic: Denied neurology symptoms, dizziness, headache, neck pain and syncope.  Psychiatric: Denied psychiatric symptoms, anxiety and depression.  Endocrine: Denied endocrine symptoms including hot flashes and night sweats.   Meds:   Current Outpatient Prescriptions on File Prior to Visit  Medication Sig Dispense Refill  . docusate sodium (COLACE) 100 MG capsule Take 200 mg by mouth daily.    Marland Kitchen levothyroxine (SYNTHROID, LEVOTHROID) 25 MCG tablet Take 1 tablet (25 mcg total) by mouth daily before breakfast. 30 tablet 3  . magnesium 30 MG tablet Take 30 mg by mouth daily.    . polyethylene glycol (MIRALAX / GLYCOLAX) packet Take 17 g by mouth daily.    . Prenat-FeFmCb-DSS-FA-DHA w/o A (CITRANATAL HARMONY) 27-1-260 MG CAPS Take 1 capsule by mouth daily. (Patient not taking: Reported on 03/25/2017) 30 capsule 6  . ranitidine (ZANTAC) 75 MG tablet Take 75 mg by mouth daily.     No current facility-administered medications on file prior to visit.     Objective:     Vitals:   04/02/17 1127  BP: 123/79  Pulse: (!) 106                Assessment:    G1P0 Patient Active Problem List   Diagnosis Date Noted  . Preterm labor 03/26/2017  . Polyhydramnios in third trimester 03/08/2017  . Hemorrhoids during pregnancy in third trimester 03/02/2017  . Gestational diabetes 12/15/2016  . Thyroid disease affecting pregnancy 12/12/2016  .  PCOS (polycystic ovarian syndrome)   . Functional constipation 11/16/2013  . IBS (irritable bowel syndrome) 11/16/2013     1. Postpartum care and examination immediately after delivery     She is doing well postpartum.  Her sugars on no medication are mostly  very good with the exception of 2 fastings.  I will not start medication at this time because she has a history of bottoming out with small doses when her sugars are almost normal. (Does not like glyburide.)   Plan:            1.  Slowly resume normal activities the exception of intercourse and heavy lifting.  2.  Continue to monitor sugars.  3.  75 g GTT at 6 week visit   4.  IUD Literature on [place] given.  Risks and benefits of each discussed.  She is considering IUD as an option for birth/cycle control. She would like this placed at her 6 week check.   Meds ordered this encounter  Medications  . CONTOUR NEXT TEST test strip    Refill:  11  . PROCTOFOAM HC rectal foam    Refill:  1  . omeprazole (PRILOSEC) 20 MG capsule    Sig: Take 20 mg by mouth daily.        F/U  No Follow-up on file.  Elonda Husky, M.D. 04/02/2017 12:57 PM

## 2017-05-06 ENCOUNTER — Telehealth: Payer: Self-pay

## 2017-05-06 NOTE — Telephone Encounter (Signed)
Spoke with pt and rescheduled appt.

## 2017-05-07 ENCOUNTER — Encounter: Payer: BLUE CROSS/BLUE SHIELD | Admitting: Obstetrics and Gynecology

## 2017-05-11 ENCOUNTER — Encounter: Payer: Self-pay | Admitting: Obstetrics and Gynecology

## 2017-05-11 ENCOUNTER — Ambulatory Visit (INDEPENDENT_AMBULATORY_CARE_PROVIDER_SITE_OTHER): Payer: BLUE CROSS/BLUE SHIELD | Admitting: Obstetrics and Gynecology

## 2017-05-11 VITALS — BP 118/85 | HR 89 | Ht 63.0 in | Wt 142.4 lb

## 2017-05-11 DIAGNOSIS — Z30430 Encounter for insertion of intrauterine contraceptive device: Secondary | ICD-10-CM | POA: Diagnosis not present

## 2017-05-11 DIAGNOSIS — Z3009 Encounter for other general counseling and advice on contraception: Secondary | ICD-10-CM

## 2017-05-11 NOTE — Progress Notes (Signed)
HPI:      [name] is a 31 y.o. G1P0 who LMP was No LMP recorded.  Subjective:   She presents today For her 6 week postpartum check. She has not had a menstrual period yet. She has discontinued breast-feeding is now bottle feeding. She has not resumed sexual activity. She is requesting birth control and is considering IUD.    Hx: The following portions of the patient's history were reviewed and updated as appropriate:             She  has a past medical history of Amenorrhea; GERD (gastroesophageal reflux disease); Gestational diabetes; PCOS (polycystic ovarian syndrome); and Thyroid disease. She  does not have any pertinent problems on file. She  has a past surgical history that includes none. Her family history includes Diabetes in her maternal grandfather; Heart failure in her father; Stroke in her maternal grandmother; Thyroid disease in her maternal grandmother and mother. She  reports that she has been smoking Cigarettes.  She has been smoking about 0.10 packs per day. She has never used smokeless tobacco. She reports that she does not drink alcohol or use drugs. She has No Known Allergies.       Review of Systems:  Review of Systems  Constitutional: Denied constitutional symptoms, night sweats, recent illness, fatigue, fever, insomnia and weight loss.  Eyes: Denied eye symptoms, eye pain, photophobia, vision change and visual disturbance.  Ears/Nose/Throat/Neck: Denied ear, nose, throat or neck symptoms, hearing loss, nasal discharge, sinus congestion and sore throat.  Cardiovascular: Denied cardiovascular symptoms, arrhythmia, chest pain/pressure, edema, exercise intolerance, orthopnea and palpitations.  Respiratory: Denied pulmonary symptoms, asthma, pleuritic pain, productive sputum, cough, dyspnea and wheezing.  Gastrointestinal: Denied, gastro-esophageal reflux, melena, nausea and vomiting.  Genitourinary: Denied genitourinary symptoms including symptomatic vaginal  discharge, pelvic relaxation issues, and urinary complaints.  Musculoskeletal: Denied musculoskeletal symptoms, stiffness, swelling, muscle weakness and myalgia.  Dermatologic: Denied dermatology symptoms, rash and scar.  Neurologic: Denied neurology symptoms, dizziness, headache, neck pain and syncope.  Psychiatric: Denied psychiatric symptoms, anxiety and depression.  Endocrine: Denied endocrine symptoms including hot flashes and night sweats.   Meds:   Current Outpatient Prescriptions on File Prior to Visit  Medication Sig Dispense Refill  . docusate sodium (COLACE) 100 MG capsule Take 200 mg by mouth daily.    . magnesium 30 MG tablet Take 30 mg by mouth daily.    Marland Kitchen omeprazole (PRILOSEC) 20 MG capsule Take 20 mg by mouth daily.    . polyethylene glycol (MIRALAX / GLYCOLAX) packet Take 17 g by mouth daily.    . CONTOUR NEXT TEST test strip   11  . levothyroxine (SYNTHROID, LEVOTHROID) 25 MCG tablet Take 1 tablet (25 mcg total) by mouth daily before breakfast. (Patient not taking: Reported on 05/11/2017) 30 tablet 3  . Prenat-FeFmCb-DSS-FA-DHA w/o A (CITRANATAL HARMONY) 27-1-260 MG CAPS Take 1 capsule by mouth daily. (Patient not taking: Reported on 03/25/2017) 30 capsule 6  . PROCTOFOAM HC rectal foam   1  . ranitidine (ZANTAC) 75 MG tablet Take 75 mg by mouth daily.     No current facility-administered medications on file prior to visit.     Objective:     Vitals:   05/11/17 1351  BP: 118/85  Pulse: 89              Physical examination   Pelvic:   Vulva: Normal appearance.  No lesions.  Vagina: No lesions or abnormalities noted.  Support: Normal pelvic support.  Urethra No masses tenderness or scarring.  Meatus Normal size without lesions or prolapse.  Cervix: Normal appearance.  No lesions.  Anus: Normal exam.  No lesions.  Perineum: Normal exam.  No lesions.        Bimanual   Uterus: Normal size.  Non-tender.  Mobile.  AV.  Adnexae: No masses.  Non-tender to  palpation.  Cul-de-sac: Negative for abnormality.   IUD Procedure Pt has read the booklet and signed the appropriate forms regarding the Mirena IUD.  All of her questions have been answered.   The cervix was cleansed with betadine solution.  After sounding the uterus and noting the position, the IUD was placed in the usual manner without problem.  The string was cut to the appropriate length.  The patient tolerated the procedure well.    Assessment:    G1P0 Patient Active Problem List   Diagnosis Date Noted  . Preterm labor 03/26/2017  . Polyhydramnios in third trimester 03/08/2017  . Hemorrhoids during pregnancy in third trimester 03/02/2017  . Gestational diabetes 12/15/2016  . Thyroid disease affecting pregnancy 12/12/2016  . PCOS (polycystic ovarian syndrome)   . Functional constipation 11/16/2013  . IBS (irritable bowel syndrome) 11/16/2013     1. Encounter for insertion of mirena IUD   2. Birth control counseling   3. Postpartum care and examination immediately after delivery        Plan:            1.  Patient needs colposcopy at that she is postpartum. She would like to do this at her 4 week IUD check visit.  2.  Follow up diabetes 4 weeks as well. Orders No orders of the defined types were placed in this encounter.   No orders of the defined types were placed in this encounter.     F/U  Return in about 4 weeks (around 06/08/2017).  Elonda Husky, M.D. 05/11/2017 2:39 PM

## 2017-06-08 ENCOUNTER — Encounter: Payer: Self-pay | Admitting: Obstetrics and Gynecology

## 2017-06-08 ENCOUNTER — Ambulatory Visit (INDEPENDENT_AMBULATORY_CARE_PROVIDER_SITE_OTHER): Payer: BLUE CROSS/BLUE SHIELD | Admitting: Obstetrics and Gynecology

## 2017-06-08 VITALS — BP 119/80 | HR 91 | Ht 63.0 in | Wt 145.1 lb

## 2017-06-08 DIAGNOSIS — R87610 Atypical squamous cells of undetermined significance on cytologic smear of cervix (ASC-US): Secondary | ICD-10-CM

## 2017-06-08 DIAGNOSIS — B977 Papillomavirus as the cause of diseases classified elsewhere: Secondary | ICD-10-CM

## 2017-06-08 DIAGNOSIS — N72 Inflammatory disease of cervix uteri: Secondary | ICD-10-CM

## 2017-06-08 DIAGNOSIS — Z30431 Encounter for routine checking of intrauterine contraceptive device: Secondary | ICD-10-CM | POA: Diagnosis not present

## 2017-06-08 NOTE — Progress Notes (Signed)
HPI:  Holly Villa is a 31 y.o.  G1P0  who presents today for evaluation and management of abnormal cervical cytology.   She also presents for follow-up of her IUD. She has had no issues with her IUD. She likes it. She has had 1 menstrual period without other bleeding.  Dysplasia History:   ASCUS - Type 16 HPV ROS:  Pertinent items are noted in HPI.  OB History  Gravida Para Term Preterm AB Living  1         1  SAB TAB Ectopic Multiple Live Births          1    # Outcome Date GA Lbr Len/2nd Weight Sex Delivery Anes PTL Lv  1 Gravida 2018    M Vag-Spont  N LIV      Past Medical History:  Diagnosis Date  . Amenorrhea   . GERD (gastroesophageal reflux disease)   . Gestational diabetes   . PCOS (polycystic ovarian syndrome)    new diagnosis per pt.   . Thyroid disease     Past Surgical History:  Procedure Laterality Date  . none      SOCIAL HISTORY: History  Alcohol Use No    Comment: did before pregnant   History  Drug Use No     Family History  Problem Relation Age of Onset  . Thyroid disease Mother   . Heart failure Father   . Stroke Maternal Grandmother   . Thyroid disease Maternal Grandmother   . Diabetes Maternal Grandfather     ALLERGIES:  Patient has no known allergies.  She has a current medication list which includes the following prescription(s): docusate sodium, omeprazole, polyethylene glycol, and citranatal harmony.  Physical Exam: -Vitals:  BP 119/80   Pulse 91   Ht  (1.6 m)   Wt 145 lb 1 oz (65.8 kg)   LMP 05/28/2017 (Exact Date)   Breastfeeding? No   BMI 25.70 kg/m  GEN: WD, WN, NAD.  A+ O x 3, good mood and affect. ABD:  NT, ND.  Soft, no masses.  No hernias noted.   Pelvic:   Vulva: Normal appearance.  No lesions.  Vagina: No lesions or abnormalities noted.  Support: Normal pelvic support.  Urethra No masses tenderness or scarring.  Meatus Normal size without lesions or prolapse.  Cervix: See below.  Anus: Normal  exam.  No lesions.  Perineum: Normal exam.  No lesions.        Bimanual   Uterus: Normal size.  Non-tender.  Mobile.  AV.  Adnexae: No masses.  Non-tender to palpation.  Cul-de-sac: Negative for abnormality.   PROCEDURE: 1.  Urine Pregnancy Test:  not done 2.  Colposcopy performed with 4% acetic acid after verbal consent obtained                           -Aceto-white Lesions Location(s): 10 o'clock.              -Biopsy performed at 10 o'clock               -ECC indicated and performed: No.     -Biopsy sites made hemostatic with pressure and Monsel's solution   -Satisfactory colposcopy: Yes.      -Evidence of Invasive cervical CA :  NO  ASSESSMENT:  Holly Villa is a 31 y.o. G1P0 here for  1. Atypical squamous cells of undetermined significance on cytologic smear of cervix (ASC-US)  2. High risk human papilloma virus (HPV) infection of cervix   3. Surveillance of previously prescribed intrauterine contraceptive device   .  PLAN: 1.  I discussed the grading system of pap smears and HPV high risk viral types.  We will discuss management after colpo results return.  No orders of the defined types were placed in this encounter.          F/U  No Follow-up on file.  Brennan Bailey ,MD 06/08/2017,3:09 PM

## 2017-06-10 LAB — PATHOLOGY

## 2017-07-09 ENCOUNTER — Encounter: Payer: Self-pay | Admitting: Obstetrics and Gynecology

## 2017-07-09 ENCOUNTER — Ambulatory Visit (INDEPENDENT_AMBULATORY_CARE_PROVIDER_SITE_OTHER): Payer: BLUE CROSS/BLUE SHIELD | Admitting: Obstetrics and Gynecology

## 2017-07-09 VITALS — BP 121/86 | HR 89 | Ht 63.0 in | Wt 149.0 lb

## 2017-07-09 DIAGNOSIS — N871 Moderate cervical dysplasia: Secondary | ICD-10-CM

## 2017-07-09 NOTE — Progress Notes (Signed)
HPI:      [name] is a 31 y.o. G1P0 who LMP was No LMP recorded. Patient is not currently having periods (Reason: IUD).  Subjective:   She presents today to discuss her colposcopy results.  These review of CIN-2. Patient states that she has completed childbearing and her husband plans to have a vasectomy. She has an IUD for birth control at this time.    Hx: The following portions of the patient's history were reviewed and updated as appropriate:             She  has a past medical history of Amenorrhea, GERD (gastroesophageal reflux disease), Gestational diabetes, PCOS (polycystic ovarian syndrome), and Thyroid disease. She does not have any pertinent problems on file. She  has a past surgical history that includes none. Her family history includes Diabetes in her maternal grandfather; Heart failure in her father; Stroke in her maternal grandmother; Thyroid disease in her maternal grandmother and mother. She  reports that she has been smoking cigarettes.  She has been smoking about 0.10 packs per day. she has never used smokeless tobacco. She reports that she does not drink alcohol or use drugs. She has No Known Allergies.       Review of Systems:  Review of Systems  Constitutional: Denied constitutional symptoms, night sweats, recent illness, fatigue, fever, insomnia and weight loss.  Eyes: Denied eye symptoms, eye pain, photophobia, vision change and visual disturbance.  Ears/Nose/Throat/Neck: Denied ear, nose, throat or neck symptoms, hearing loss, nasal discharge, sinus congestion and sore throat.  Cardiovascular: Denied cardiovascular symptoms, arrhythmia, chest pain/pressure, edema, exercise intolerance, orthopnea and palpitations.  Respiratory: Denied pulmonary symptoms, asthma, pleuritic pain, productive sputum, cough, dyspnea and wheezing.  Gastrointestinal: Denied, gastro-esophageal reflux, melena, nausea and vomiting.  Genitourinary: Denied genitourinary symptoms  including symptomatic vaginal discharge, pelvic relaxation issues, and urinary complaints.  Musculoskeletal: Denied musculoskeletal symptoms, stiffness, swelling, muscle weakness and myalgia.  Dermatologic: Denied dermatology symptoms, rash and scar.  Neurologic: Denied neurology symptoms, dizziness, headache, neck pain and syncope.  Psychiatric: Denied psychiatric symptoms, anxiety and depression.  Endocrine: Denied endocrine symptoms including hot flashes and night sweats.   Meds:   Current Outpatient Medications on File Prior to Visit  Medication Sig Dispense Refill  . docusate sodium (COLACE) 100 MG capsule Take 200 mg by mouth daily.    Marland Kitchen omeprazole (PRILOSEC) 20 MG capsule Take 20 mg by mouth daily.    . polyethylene glycol (MIRALAX / GLYCOLAX) packet Take 17 g by mouth daily.     No current facility-administered medications on file prior to visit.     Objective:     Vitals:   07/09/17 0834  BP: 121/86  Pulse: 89              CIN-2  Colposcopically directed biopsy pathology results reviewed directly with the patient.  Assessment:    G1P0 Patient Active Problem List   Diagnosis Date Noted  . Preterm labor 03/26/2017  . Polyhydramnios in third trimester 03/08/2017  . Hemorrhoids during pregnancy in third trimester 03/02/2017  . Gestational diabetes 12/15/2016  . Thyroid disease affecting pregnancy 12/12/2016  . PCOS (polycystic ovarian syndrome)   . Functional constipation 11/16/2013  . IBS (irritable bowel syndrome) 11/16/2013     1. 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  Discussed multiple options with the patient for management  of CIN-2.  Pap smear abnormalities and colposcopically directed biopsy abnormalities discussed.  I have given her 2 options.  Option 1 is to repeat a colposcopy in 6 months.  Option 2 is to perform a LEEP. The risks and benefits of each option were reviewed in detail. Patient  has chosen LEEP and will schedule this in the next few weeks. Literature on LEEP given.  All questions answered. No orders of the defined types were placed in this encounter.   No orders of the defined types were placed in this encounter.     F/U  No Follow-up on file. I spent 21 minutes with this patient of which greater than 50% was spent discussing abnormal Pap smears and colposcopies.  Management of abnormals.  LEEP versus colposcopically directed biopsies.  Future fertility and future possible treatment options.  Elonda Huskyavid J. Evans, M.D. 07/09/2017 9:53 AM

## 2017-08-13 ENCOUNTER — Encounter: Payer: BLUE CROSS/BLUE SHIELD | Admitting: Obstetrics and Gynecology

## 2017-09-01 ENCOUNTER — Encounter: Payer: Self-pay | Admitting: Obstetrics and Gynecology

## 2017-09-02 ENCOUNTER — Telehealth: Payer: Self-pay

## 2017-09-02 NOTE — Telephone Encounter (Signed)
mychart message left

## 2017-09-03 ENCOUNTER — Encounter: Payer: Self-pay | Admitting: Obstetrics and Gynecology

## 2017-09-03 ENCOUNTER — Ambulatory Visit (INDEPENDENT_AMBULATORY_CARE_PROVIDER_SITE_OTHER): Payer: BLUE CROSS/BLUE SHIELD | Admitting: Obstetrics and Gynecology

## 2017-09-03 VITALS — BP 143/88 | HR 80 | Ht 63.0 in | Wt 147.1 lb

## 2017-09-03 DIAGNOSIS — N871 Moderate cervical dysplasia: Secondary | ICD-10-CM | POA: Diagnosis not present

## 2017-09-03 NOTE — Progress Notes (Signed)
HPI:      [name] is a 32 y.o. G1P0 who LMP was Patient's last menstrual period was 08/29/2017 (exact date).  Subjective:   She presents today for LEEP.  She has elected to have this performed for CIN-2.  She has an IUD in place for birth control.     Hx: The following portions of the patient's history were reviewed and updated as appropriate:             She  has a past medical history of Amenorrhea, GERD (gastroesophageal reflux disease), Gestational diabetes, PCOS (polycystic ovarian syndrome), and Thyroid disease. She does not have any pertinent problems on file. She  has a past surgical history that includes none. Her family history includes Diabetes in her maternal grandfather; Heart failure in her father; Stroke in her maternal grandmother; Thyroid disease in her maternal grandmother and mother. She  reports that she has been smoking cigarettes.  She has been smoking about 0.10 packs per day. she has never used smokeless tobacco. She reports that she does not drink alcohol or use drugs. She has No Known Allergies.       Review of Systems:  Review of Systems  Constitutional: Denied constitutional symptoms, night sweats, recent illness, fatigue, fever, insomnia and weight loss.  Eyes: Denied eye symptoms, eye pain, photophobia, vision change and visual disturbance.  Ears/Nose/Throat/Neck: Denied ear, nose, throat or neck symptoms, hearing loss, nasal discharge, sinus congestion and sore throat.  Cardiovascular: Denied cardiovascular symptoms, arrhythmia, chest pain/pressure, edema, exercise intolerance, orthopnea and palpitations.  Respiratory: Denied pulmonary symptoms, asthma, pleuritic pain, productive sputum, cough, dyspnea and wheezing.  Gastrointestinal: Denied, gastro-esophageal reflux, melena, nausea and vomiting.  Genitourinary: Denied genitourinary symptoms including symptomatic vaginal discharge, pelvic relaxation issues, and urinary complaints.  Musculoskeletal:  Denied musculoskeletal symptoms, stiffness, swelling, muscle weakness and myalgia.  Dermatologic: Denied dermatology symptoms, rash and scar.  Neurologic: Denied neurology symptoms, dizziness, headache, neck pain and syncope.  Psychiatric: Denied psychiatric symptoms, anxiety and depression.  Endocrine: Denied endocrine symptoms including hot flashes and night sweats.   Meds:   Current Outpatient Medications on File Prior to Visit  Medication Sig Dispense Refill  . docusate sodium (COLACE) 100 MG capsule Take 200 mg by mouth daily.    Marland Kitchen levonorgestrel (MIRENA) 20 MCG/24HR IUD 1 each by Intrauterine route once.    Marland Kitchen omeprazole (PRILOSEC) 20 MG capsule Take 20 mg by mouth daily.     No current facility-administered medications on file prior to visit.     Objective:     Vitals:   09/03/17 0816  BP: (!) 143/88  Pulse: 80              Physical examination   Pelvic:   Vulva: Normal appearance.  No lesions.  Vagina: No lesions or abnormalities noted.  Support: Normal pelvic support.  Urethra No masses tenderness or scarring.  Meatus Normal size without lesions or prolapse.  Cervix: Normal appearance.  No lesions.  Anus: Normal exam.  No lesions.  Perineum: Normal exam.  No lesions.        Bimanual   Uterus: Normal size.  Non-tender.  Mobile.  AV.  Adnexae: No masses.  Non-tender to palpation.  Cul-de-sac: Negative for abnormality.   LEEP The LEEP has been explained to the patient in detail; risks/benefits reviewed.  The risks include, but are not limited to, bleeding, infection, and the possibility of cervical stenosis or cervical incompetence.  The patient had previously been given information regarding  abnormal PAP smears and their relationship to HPV.  We have discussed the natural course and history of HPV, the possibility of incomplete treatment by the LEEP, as well as the possibility of recurrence.  I have reviewed the consent form for LEEP with her, and she fully understands  its contents.  We have discussed the procedure itself. I have informed her that following the LEEP she should refrain from intercourse and the use of tampons for three weeks, and that she should also expect some spotting and brown/black discharge over the next several days.  We have discussed the fact that vaginal bleeding, differentiated from spotting, is not normal and that if she should have this complication, she should contact me immediately.  The follow-up after LEEP will be PAP smears or viral typing performed at regular intervals for up to 3 years.  Should these all prove to be normal, she will then be back on typical cervical screening.  I have answered all of her questions, and I believe she has an adequate understanding of the LEEP, its implications, and the necessity of follow-up care.  LEEP PROCEDURE NOTE  I again discussed her colpo results and explained the procedure of LEEP.  All questions were answered and she signed the consent form.  LEEP performed in the usual manner after reviewing the previous colpo findings and results. Lugol's solution was used to identify any abnormal areas of the cervix.  These were compared with the previous colpo pictures. The cervix was cleansed with betadine solution. Local injection of Lidocaine was performed for anesthesia. 50 Watts Blend current was used to remove the specimen.  It was labeled accordingly. The base and edges of the defect was then cauterized using coagulation current. Monsel's solution was then applied in the usual manner. The patient tolerated the procedure without incident. Care was taken during the procedure to avoid the IUD strings.  Holly Villa, M.D. 09/03/2017 9:33 AM     Assessment:    G1P0 Patient Active Problem List   Diagnosis Date Noted  . Preterm labor 03/26/2017  . Polyhydramnios in third trimester 03/08/2017  . Hemorrhoids during pregnancy in third trimester 03/02/2017  . Gestational diabetes 12/15/2016   . Thyroid disease affecting pregnancy 12/12/2016  . PCOS (polycystic ovarian syndrome)   . Functional constipation 11/16/2013  . IBS (irritable bowel syndrome) 11/16/2013     1. Dysplasia of cervix, high grade CIN 2        Plan:            1.  LEEP performed Orders No orders of the defined types were placed in this encounter.   No orders of the defined types were placed in this encounter.     F/U  No Follow-up on file.  Holly Villa, M.D. 09/03/2017 9:32 AM

## 2017-09-03 NOTE — Addendum Note (Signed)
Addended by: Brooke Dare on: 09/03/2017 10:14 AM   Modules accepted: Orders

## 2017-09-10 LAB — PATHOLOGY

## 2017-09-27 ENCOUNTER — Ambulatory Visit (INDEPENDENT_AMBULATORY_CARE_PROVIDER_SITE_OTHER): Payer: BLUE CROSS/BLUE SHIELD | Admitting: Obstetrics and Gynecology

## 2017-09-27 ENCOUNTER — Encounter: Payer: Self-pay | Admitting: Obstetrics and Gynecology

## 2017-09-27 VITALS — BP 123/84 | HR 111 | Ht 61.0 in | Wt 151.0 lb

## 2017-09-27 DIAGNOSIS — N871 Moderate cervical dysplasia: Secondary | ICD-10-CM | POA: Diagnosis not present

## 2017-09-27 DIAGNOSIS — R102 Pelvic and perineal pain: Secondary | ICD-10-CM | POA: Diagnosis not present

## 2017-09-27 NOTE — Progress Notes (Signed)
HPI:      Holly Villa is a 32 y.o. G1P0 who LMP was Patient's last menstrual period was 08/29/2017 (exact date).  Subjective:   She presents today 1 month after LEEP.  She complains of pelvic pressure symptoms.  She had a vaginal discharge which she says is now resolved.  No bleeding or cramping of significant nature. She has not resumed intercourse.   Hx: The following portions of the patient's history were reviewed and updated as appropriate:             She  has a past medical history of Amenorrhea, GERD (gastroesophageal reflux disease), Gestational diabetes, PCOS (polycystic ovarian syndrome), and Thyroid disease. She does not have any pertinent problems on file. She  has a past surgical history that includes none. Her family history includes Diabetes in her maternal grandfather; Heart failure in her father; Stroke in her maternal grandmother; Thyroid disease in her maternal grandmother and mother. She  reports that she has been smoking cigarettes.  She has been smoking about 0.25 packs per day. she has never used smokeless tobacco. She reports that she drinks alcohol. She reports that she does not use drugs. She has No Known Allergies.       Review of Systems:  Review of Systems  Constitutional: Denied constitutional symptoms, night sweats, recent illness, fatigue, fever, insomnia and weight loss.  Eyes: Denied eye symptoms, eye pain, photophobia, vision change and visual disturbance.  Ears/Nose/Throat/Neck: Denied ear, nose, throat or neck symptoms, hearing loss, nasal discharge, sinus congestion and sore throat.  Cardiovascular: Denied cardiovascular symptoms, arrhythmia, chest pain/pressure, edema, exercise intolerance, orthopnea and palpitations.  Respiratory: Denied pulmonary symptoms, asthma, pleuritic pain, productive sputum, cough, dyspnea and wheezing.  Gastrointestinal: Denied, gastro-esophageal reflux, melena, nausea and vomiting.  Genitourinary: Denied genitourinary  symptoms including symptomatic vaginal discharge, pelvic relaxation issues, and urinary complaints.  Musculoskeletal: Denied musculoskeletal symptoms, stiffness, swelling, muscle weakness and myalgia.  Dermatologic: Denied dermatology symptoms, rash and scar.  Neurologic: Denied neurology symptoms, dizziness, headache, neck pain and syncope.  Psychiatric: Denied psychiatric symptoms, anxiety and depression.  Endocrine: Denied endocrine symptoms including hot flashes and night sweats.   Meds:   Current Outpatient Medications on File Prior to Visit  Medication Sig Dispense Refill  . docusate sodium (COLACE) 100 MG capsule Take 200 mg by mouth daily.    Marland Kitchen levonorgestrel (MIRENA) 20 MCG/24HR IUD 1 each by Intrauterine route once.    Marland Kitchen omeprazole (PRILOSEC) 20 MG capsule Take 20 mg by mouth daily.     No current facility-administered medications on file prior to visit.     Objective:     Vitals:   09/27/17 0829  BP: 123/84  Pulse: (!) 111              Physical examination   Pelvic:   Vulva: Normal appearance.  No lesions.  Vagina: No lesions or abnormalities noted.  Support: Normal pelvic support.  Urethra No masses tenderness or scarring.  Meatus Normal size without lesions or prolapse.  Cervix:  Healed well.  Obviously status post procedure.  No lesions. IUD strings noted at cervical os.  Anus: Normal exam.  No lesions.  Perineum: Normal exam.  No lesions.        Bimanual   Uterus: Normal size.  Non-tender.  Mobile.  AV.  Adnexae: No masses.  Non-tender to palpation.  Cul-de-sac: Negative for abnormality.     Assessment:    G1P0 Patient Active Problem List   Diagnosis Date  Noted  . Preterm labor 03/26/2017  . Polyhydramnios in third trimester 03/08/2017  . Hemorrhoids during pregnancy in third trimester 03/02/2017  . Gestational diabetes 12/15/2016  . Thyroid disease affecting pregnancy 12/12/2016  . PCOS (polycystic ovarian syndrome)   . Functional constipation  11/16/2013  . IBS (irritable bowel syndrome) 11/16/2013     1. CIN II (cervical intraepithelial neoplasia II)   2. Pelvic pain in female     Question small ovarian cyst causing pelvic pressure.  Doubt from LEEP or IUD.  Cervix healing well.   Plan:            1.  Pelvic ultrasound for pelvic pain and pressure symptoms  2.  First Pap after LEEP in 3 months. Orders Orders Placed This Encounter  Procedures  . US PELVIS TRANSVANGINAL NON-OB (TV ONLY)    No orders of the defined types were placed in this encounter.     F/U  Return in about 3 months (around 12/26/2017).  Elonda Husky, M.D. 09/27/2017 9:06 AM

## 2017-10-15 ENCOUNTER — Other Ambulatory Visit: Payer: BLUE CROSS/BLUE SHIELD

## 2017-12-28 ENCOUNTER — Encounter: Payer: Self-pay | Admitting: Obstetrics and Gynecology

## 2017-12-28 ENCOUNTER — Ambulatory Visit (INDEPENDENT_AMBULATORY_CARE_PROVIDER_SITE_OTHER): Payer: BLUE CROSS/BLUE SHIELD | Admitting: Obstetrics and Gynecology

## 2017-12-28 VITALS — BP 119/84 | HR 90 | Ht 62.0 in | Wt 151.3 lb

## 2017-12-28 DIAGNOSIS — R102 Pelvic and perineal pain: Secondary | ICD-10-CM

## 2017-12-28 DIAGNOSIS — N871 Moderate cervical dysplasia: Secondary | ICD-10-CM | POA: Diagnosis not present

## 2017-12-28 NOTE — Progress Notes (Signed)
HPI:      [name] is a 32 y.o. G1P0 who LMP was Patient's last menstrual period was 12/03/2017.  Subjective:   She presents today 4 months after LEEP for CIN-2.  She reports no problems.  She is having occasional periods with her IUD but does not consider this an issue.    Hx: The following portions of the patient's history were reviewed and updated as appropriate:             She  has a past medical history of Amenorrhea, GERD (gastroesophageal reflux disease), Gestational diabetes, PCOS (polycystic ovarian syndrome), and Thyroid disease. She does not have any pertinent problems on file. She  has a past surgical history that includes none. Her family history includes Diabetes in her maternal grandfather; Heart failure in her father; Stroke in her maternal grandmother; Thyroid disease in her maternal grandmother and mother. She  reports that she has been smoking cigarettes.  She has been smoking about 0.25 packs per day. She has never used smokeless tobacco. She reports that she drinks alcohol. She reports that she does not use drugs. She has a current medication list which includes the following prescription(s): docusate sodium, levonorgestrel, metformin, and omeprazole. She has No Known Allergies.       Review of Systems:  Review of Systems  Constitutional: Denied constitutional symptoms, night sweats, recent illness, fatigue, fever, insomnia and weight loss.  Eyes: Denied eye symptoms, eye pain, photophobia, vision change and visual disturbance.  Ears/Nose/Throat/Neck: Denied ear, nose, throat or neck symptoms, hearing loss, nasal discharge, sinus congestion and sore throat.  Cardiovascular: Denied cardiovascular symptoms, arrhythmia, chest pain/pressure, edema, exercise intolerance, orthopnea and palpitations.  Respiratory: Denied pulmonary symptoms, asthma, pleuritic pain, productive sputum, cough, dyspnea and wheezing.  Gastrointestinal: Denied, gastro-esophageal reflux,  melena, nausea and vomiting.  Genitourinary: Denied genitourinary symptoms including symptomatic vaginal discharge, pelvic relaxation issues, and urinary complaints.  Musculoskeletal: Denied musculoskeletal symptoms, stiffness, swelling, muscle weakness and myalgia.  Dermatologic: Denied dermatology symptoms, rash and scar.  Neurologic: Denied neurology symptoms, dizziness, headache, neck pain and syncope.  Psychiatric: Denied psychiatric symptoms, anxiety and depression.  Endocrine: Denied endocrine symptoms including hot flashes and night sweats.   Meds:   Current Outpatient Medications on File Prior to Visit  Medication Sig Dispense Refill  . docusate sodium (COLACE) 100 MG capsule Take 200 mg by mouth daily.    Marland Kitchen levonorgestrel (MIRENA) 20 MCG/24HR IUD 1 each by Intrauterine route once.    . metFORMIN (GLUCOPHAGE) 500 MG tablet Take by mouth 2 (two) times daily with a meal.    . omeprazole (PRILOSEC) 20 MG capsule Take 20 mg by mouth daily.     No current facility-administered medications on file prior to visit.     Objective:     Vitals:   12/28/17 0809  BP: 119/84  Pulse: 90              Physical examination   Pelvic:   Vulva: Normal appearance.  No lesions.  Vagina: No lesions or abnormalities noted.  Support: Normal pelvic support.  Urethra No masses tenderness or scarring.  Meatus Normal size without lesions or prolapse.  Cervix: Normal appearance.  No lesions.  IUD string noted at cervical loss.    Anus: Normal exam.  No lesions.  Perineum: Normal exam.  No lesions.        Bimanual   Uterus: Normal size.  Non-tender.  Mobile.  AV.  Adnexae: No masses.  Non-tender to palpation.  Cul-de-sac: Negative for abnormality.     Assessment:    G1P0 Patient Active Problem List   Diagnosis Date Noted  . Preterm labor 03/26/2017  . Polyhydramnios in third trimester 03/08/2017  . Hemorrhoids during pregnancy in third trimester 03/02/2017  . Gestational diabetes  12/15/2016  . Thyroid disease affecting pregnancy 12/12/2016  . PCOS (polycystic ovarian syndrome)   . Functional constipation 11/16/2013  . IBS (irritable bowel syndrome) 11/16/2013     1. CIN II (cervical intraepithelial neoplasia II)   2. Pelvic pain in female     Patient is not having any issues.   Plan:            1.  Pap performed today, next Pap in 6 months. Orders No orders of the defined types were placed in this encounter.   No orders of the defined types were placed in this encounter.     F/U  Return in about 6 months (around 06/29/2018).  Elonda Husky, M.D. 12/28/2017 8:50 AM

## 2017-12-30 LAB — PAP IG W/ RFLX HPV ASCU: PAP Smear Comment: 0

## 2018-01-21 ENCOUNTER — Other Ambulatory Visit: Payer: Self-pay | Admitting: Internal Medicine

## 2018-01-21 DIAGNOSIS — R29818 Other symptoms and signs involving the nervous system: Secondary | ICD-10-CM

## 2018-02-04 ENCOUNTER — Ambulatory Visit: Payer: BLUE CROSS/BLUE SHIELD

## 2018-02-08 ENCOUNTER — Ambulatory Visit: Payer: BLUE CROSS/BLUE SHIELD

## 2018-03-18 ENCOUNTER — Ambulatory Visit: Payer: BLUE CROSS/BLUE SHIELD | Admitting: Diagnostic Neuroimaging

## 2018-06-28 ENCOUNTER — Encounter: Payer: BLUE CROSS/BLUE SHIELD | Admitting: Obstetrics and Gynecology

## 2018-07-21 ENCOUNTER — Encounter: Payer: Self-pay | Admitting: Obstetrics and Gynecology

## 2018-07-27 ENCOUNTER — Ambulatory Visit (INDEPENDENT_AMBULATORY_CARE_PROVIDER_SITE_OTHER): Payer: BLUE CROSS/BLUE SHIELD | Admitting: Obstetrics and Gynecology

## 2018-07-27 ENCOUNTER — Other Ambulatory Visit (HOSPITAL_COMMUNITY)
Admission: RE | Admit: 2018-07-27 | Discharge: 2018-07-27 | Disposition: A | Discharge status: Home / Self Care | Payer: BLUE CROSS/BLUE SHIELD | Source: Ambulatory Visit | Attending: Obstetrics and Gynecology | Admitting: Obstetrics and Gynecology

## 2018-07-27 ENCOUNTER — Encounter: Payer: Self-pay | Admitting: Obstetrics and Gynecology

## 2018-07-27 VITALS — BP 123/80 | HR 85 | Ht 62.0 in | Wt 143.0 lb

## 2018-07-27 DIAGNOSIS — N871 Moderate cervical dysplasia: Secondary | ICD-10-CM | POA: Diagnosis present

## 2018-07-27 NOTE — Progress Notes (Signed)
Pt here today for 6 month follow up pap smear after LEEP procedure. Pt informed me that she is on a new cholesterol medication but does not know the name of it.

## 2018-07-27 NOTE — Progress Notes (Signed)
HPI:      [name] is a 32 y.o. G1P0 who LMP was Patient's last menstrual period was 07/21/2018 (exact date).  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  She reports she is not having any problems.  Her first Pap after LEEP showed no evidence of dysplasia.    Hx: The following portions of the patient's history were reviewed and updated as appropriate:             She  has a past medical history of Amenorrhea, GERD (gastroesophageal reflux disease), Gestational diabetes, PCOS (polycystic ovarian syndrome), and Thyroid disease. She does not have any pertinent problems on file. She  has a past surgical history that includes none. Her family history includes Diabetes in her maternal grandfather; Heart failure in her father; Stroke in her maternal grandmother; Thyroid disease in her maternal grandmother and mother. She  reports that she has been smoking cigarettes. She has been smoking about 0.25 packs per day. She has never used smokeless tobacco. She reports that she drinks alcohol. She reports that she does not use drugs. She has a current medication list which includes the following prescription(s): docusate sodium, levonorgestrel, metformin, and omeprazole. She has No Known Allergies.       Review of Systems:  Review of Systems  Constitutional: Denied constitutional symptoms, night sweats, recent illness, fatigue, fever, insomnia and weight loss.  Eyes: Denied eye symptoms, eye pain, photophobia, vision change and visual disturbance.  Ears/Nose/Throat/Neck: Denied ear, nose, throat or neck symptoms, hearing loss, nasal discharge, sinus congestion and sore throat.  Cardiovascular: Denied cardiovascular symptoms, arrhythmia, chest pain/pressure, edema, exercise intolerance, orthopnea and palpitations.  Respiratory: Denied pulmonary symptoms, asthma, pleuritic pain, productive sputum, cough, dyspnea and wheezing.  Gastrointestinal: Denied,  gastro-esophageal reflux, melena, nausea and vomiting.  Genitourinary: Denied genitourinary symptoms including symptomatic vaginal discharge, pelvic relaxation issues, and urinary complaints.  Musculoskeletal: Denied musculoskeletal symptoms, stiffness, swelling, muscle weakness and myalgia.  Dermatologic: Denied dermatology symptoms, rash and scar.  Neurologic: Denied neurology symptoms, dizziness, headache, neck pain and syncope.  Psychiatric: Denied psychiatric symptoms, anxiety and depression.  Endocrine: Denied endocrine symptoms including hot flashes and night sweats.   Meds:   Current Outpatient Medications on File Prior to Visit  Medication Sig Dispense Refill  . docusate sodium (COLACE) 100 MG capsule Take 200 mg by mouth daily.    Marland Kitchen levonorgestrel (MIRENA) 20 MCG/24HR IUD 1 each by Intrauterine route once.    . metFORMIN (GLUCOPHAGE) 500 MG tablet Take by mouth 2 (two) times daily with a meal.    . omeprazole (PRILOSEC) 20 MG capsule Take 20 mg by mouth daily.     No current facility-administered medications on file prior to visit.     Objective:     Vitals:   07/27/18 1115  BP: 123/80  Pulse: 85              Physical examination   Pelvic:   Vulva: Normal appearance.  No lesions.  Vagina: No lesions or abnormalities noted.  Support: Normal pelvic support.  Urethra No masses tenderness or scarring.  Meatus Normal size without lesions or prolapse.  Cervix: Normal appearance.  No lesions.  Obviously status post procedure  Anus: Normal exam.  No lesions.  Perineum: Normal exam.  No lesions.        Bimanual   Uterus: Normal size.  Non-tender.  Mobile.  AV.  Adnexae: No masses.  Non-tender to palpation.  Cul-de-sac: Negative  for abnormality.     Assessment:    G1P0 Patient Active Problem List   Diagnosis Date Noted  . Preterm labor 03/26/2017  . Polyhydramnios in third trimester 03/08/2017  . Hemorrhoids during pregnancy in third trimester 03/02/2017  .  Gestational diabetes 12/15/2016  . Thyroid disease affecting pregnancy 12/12/2016  . PCOS (polycystic ovarian syndrome)   . Functional constipation 11/16/2013  . IBS (irritable bowel syndrome) 11/16/2013     1. CIN II (cervical intraepithelial neoplasia II)     Currently resolved with previous LEEP   Plan:            1.  Pap performed today.  If this is normal would consider annual Pap smears with annual exams. Orders No orders of the defined types were placed in this encounter.   No orders of the defined types were placed in this encounter.     F/U  Return for We will contact her with any abnormal test results, Annual Physical. I spent 16 minutes involved in the care of this patient of which greater than 50% was spent discussing LEEP, CIN-2, future follow-ups.  All questions answered.  Elonda Husky, M.D. 07/27/2018 12:29 PM

## 2018-08-02 LAB — CYTOLOGY - PAP: Diagnosis: NEGATIVE

## 2018-10-07 DIAGNOSIS — I219 Acute myocardial infarction, unspecified: Secondary | ICD-10-CM

## 2018-10-07 HISTORY — DX: Acute myocardial infarction, unspecified: I21.9

## 2018-10-08 DIAGNOSIS — N871 Moderate cervical dysplasia: Secondary | ICD-10-CM | POA: Insufficient documentation

## 2018-10-08 DIAGNOSIS — E119 Type 2 diabetes mellitus without complications: Secondary | ICD-10-CM | POA: Insufficient documentation

## 2018-10-08 DIAGNOSIS — F17200 Nicotine dependence, unspecified, uncomplicated: Secondary | ICD-10-CM | POA: Insufficient documentation

## 2018-10-08 DIAGNOSIS — K219 Gastro-esophageal reflux disease without esophagitis: Secondary | ICD-10-CM | POA: Insufficient documentation

## 2018-10-08 DIAGNOSIS — E785 Hyperlipidemia, unspecified: Secondary | ICD-10-CM | POA: Insufficient documentation

## 2018-10-08 DIAGNOSIS — E1169 Type 2 diabetes mellitus with other specified complication: Secondary | ICD-10-CM | POA: Insufficient documentation

## 2018-10-12 DIAGNOSIS — Z955 Presence of coronary angioplasty implant and graft: Secondary | ICD-10-CM | POA: Insufficient documentation

## 2018-11-07 DIAGNOSIS — F411 Generalized anxiety disorder: Secondary | ICD-10-CM | POA: Insufficient documentation

## 2018-11-07 DIAGNOSIS — F4323 Adjustment disorder with mixed anxiety and depressed mood: Secondary | ICD-10-CM | POA: Insufficient documentation

## 2019-11-22 ENCOUNTER — Telehealth: Payer: BC Managed Care – PPO | Admitting: Family

## 2019-11-22 DIAGNOSIS — R399 Unspecified symptoms and signs involving the genitourinary system: Secondary | ICD-10-CM

## 2019-11-22 MED ORDER — CEPHALEXIN 500 MG PO CAPS
500.0000 mg | ORAL_CAPSULE | Freq: Two times a day (BID) | ORAL | 0 refills | Status: DC
Start: 2019-11-22 — End: 2020-01-09

## 2019-11-22 NOTE — Progress Notes (Signed)

## 2019-11-30 ENCOUNTER — Telehealth: Payer: Self-pay | Admitting: Surgical

## 2019-11-30 ENCOUNTER — Telehealth: Payer: Self-pay | Admitting: Physician Assistant

## 2019-11-30 ENCOUNTER — Encounter: Payer: Self-pay | Admitting: Obstetrics and Gynecology

## 2019-11-30 ENCOUNTER — Ambulatory Visit (INDEPENDENT_AMBULATORY_CARE_PROVIDER_SITE_OTHER): Payer: Commercial Managed Care - PPO | Admitting: Obstetrics and Gynecology

## 2019-11-30 ENCOUNTER — Other Ambulatory Visit: Payer: Self-pay

## 2019-11-30 VITALS — BP 120/84 | HR 102 | Ht 60.0 in | Wt 153.0 lb

## 2019-11-30 DIAGNOSIS — R350 Frequency of micturition: Secondary | ICD-10-CM | POA: Diagnosis not present

## 2019-11-30 DIAGNOSIS — R102 Pelvic and perineal pain: Secondary | ICD-10-CM

## 2019-11-30 DIAGNOSIS — R399 Unspecified symptoms and signs involving the genitourinary system: Secondary | ICD-10-CM

## 2019-11-30 LAB — POCT URINALYSIS DIPSTICK
Bilirubin, UA: NEGATIVE
Blood, UA: NEGATIVE
Glucose, UA: POSITIVE — AB
Ketones, UA: NEGATIVE
Leukocytes, UA: NEGATIVE
Nitrite, UA: NEGATIVE
Protein, UA: POSITIVE — AB
Spec Grav, UA: 1.01 (ref 1.010–1.025)
Urobilinogen, UA: 0.2 E.U./dL
pH, UA: 5 (ref 5.0–8.0)

## 2019-11-30 NOTE — Progress Notes (Signed)
HPI:      Ms. Shontavia Mickel is a 34 y.o. G1P0 who LMP was No LMP recorded. (Menstrual status: IUD).  Subjective:   She presents today with complaint of 3-week history of pelvic cramping frequent urination and suspected UTI. She called into a virtual office visit and was prescribed Keflex for UTI.  She just finished that today.  She reports that she continues to experience some cramping.  She denies any vaginal bleeding. She has an IUD in place for birth control and she routinely does not have menses with the IUD. When she first started to have symptoms of midline pelvic cramping she took AZO and this alleviated the cramping.  Of significant note 1 year ago the patient had myocardial infarction and had stents placed.  She quit smoking at that time.  She believes that NSAID S may be contraindicated with stents. Patient is diabetic and states that she has reduced her A1c from 7 down to 6.    Hx: The following portions of the patient's history were reviewed and updated as appropriate:             She  has a past medical history of Amenorrhea, GERD (gastroesophageal reflux disease), Gestational diabetes, Heart attack ([place]) (10/07/2018), PCOS (polycystic ovarian syndrome), and Thyroid disease. She does not have any pertinent problems on file. She  has a past surgical history that includes none and Coronary angioplasty with stent. Her family history includes Diabetes in her maternal grandfather; Heart failure in her father; Stroke in her maternal grandmother; Thyroid disease in her maternal grandmother and mother. She  reports that she quit smoking about 13 months ago. Her smoking use included cigarettes. She smoked 0.25 packs per day. She has never used smokeless tobacco. She reports current alcohol use. She reports that she does not use drugs. She has a current medication list which includes the following prescription(s): levonorgestrel, metformin, omeprazole, cephalexin, and docusate sodium. She has  No Known Allergies.       Review of Systems:  Review of Systems  Constitutional: Denied constitutional symptoms, night sweats, recent illness, fatigue, fever, insomnia and weight loss.  Eyes: Denied eye symptoms, eye pain, photophobia, vision change and visual disturbance.  Ears/Nose/Throat/Neck: Denied ear, nose, throat or neck symptoms, hearing loss, nasal discharge, sinus congestion and sore throat.  Cardiovascular: Denied cardiovascular symptoms, arrhythmia, chest pain/pressure, edema, exercise intolerance, orthopnea and palpitations.  Respiratory: Denied pulmonary symptoms, asthma, pleuritic pain, productive sputum, cough, dyspnea and wheezing.  Gastrointestinal: Denied, gastro-esophageal reflux, melena, nausea and vomiting.  Genitourinary: See HPI for additional information.  Musculoskeletal: Denied musculoskeletal symptoms, stiffness, swelling, muscle weakness and myalgia.  Dermatologic: Denied dermatology symptoms, rash and scar.  Neurologic: Denied neurology symptoms, dizziness, headache, neck pain and syncope.  Psychiatric: Denied psychiatric symptoms, anxiety and depression.  Endocrine: Denied endocrine symptoms including hot flashes and night sweats.   Meds:   Current Outpatient Medications on File Prior to Visit  Medication Sig Dispense Refill  . levonorgestrel (MIRENA) 20 MCG/24HR IUD 1 each by Intrauterine route once.    . metFORMIN (GLUCOPHAGE) 500 MG tablet Take by mouth 2 (two) times daily with a meal.    . omeprazole (PRILOSEC) 20 MG capsule Take 20 mg by mouth daily.    . cephALEXin (KEFLEX) 500 MG capsule Take 1 capsule (500 mg total) by mouth 2 (two) times daily. (Patient not taking: Reported on 11/30/2019) 14 capsule 0  . docusate sodium (COLACE) 100 MG capsule Take 200 mg by mouth daily.  No current facility-administered medications on file prior to visit.    Objective:     Vitals:   11/30/19 1508  BP: 120/84  Pulse: (!) 102              UA-negative  -except for increased glucose.  Assessment:    G1P0 Patient Active Problem List   Diagnosis Date Noted  . Preterm labor 03/26/2017  . Polyhydramnios in third trimester 03/08/2017  . Hemorrhoids during pregnancy in third trimester 03/02/2017  . Gestational diabetes 12/15/2016  . Thyroid disease affecting pregnancy 12/12/2016  . PCOS (polycystic ovarian syndrome)   . Functional constipation 11/16/2013  . IBS (irritable bowel syndrome) 11/16/2013     1. Frequent urination   2. Pelvic cramping     Possible continued UTI despite Keflex use.  This is especially true since AZO seem to eliminate her pelvic cramping and symptoms.   Plan:            1.  Urine for culture-we will contact patient if infection still present  2.  I recommend NSAID S but recommend that she first contact cardiology for approval with stents.  3.  Patient has a history of CIN-2 and underwent LEEP.  She is overdue for Pap smear and I have advised her to present for an annual examination and Pap.  Orders Orders Placed This Encounter  Procedures  . Urine Culture  . POCT urinalysis dipstick    No orders of the defined types were placed in this encounter.     F/U  Return in about 6 weeks (around 01/11/2020) for Annual Physical. I spent 23 minutes involved in the care of this patient preparing to see the patient by obtaining and reviewing her medical history (including labs, imaging tests and prior procedures), documenting clinical information in the electronic health record (EHR), counseling and coordinating care plans, writing and sending prescriptions, ordering tests or procedures and directly communicating with the patient by discussing pertinent items from her history and physical exam as well as detailing my assessment and plan as noted above so that she has an informed understanding.  All of her questions were answered.  Elonda Husky, M.D. 11/30/2019 3:35 PM

## 2019-11-30 NOTE — Progress Notes (Signed)
Hi Holly Villa,   I am sorry you are still not feeling well.  Based on what you shared with me, I feel your condition warrants further evaluation and I recommend that you be seen for a face to face office visit.  You may need to provide a urine sample.  If you cannot get in to see your PCP in the next day or so, I recommend going to one of our Urgent Care Centers (see below).    NOTE: If you entered your credit card information for this eVisit, you will not be charged. You may see a "hold" on your card for the $35 but that hold will drop off and you will not have a charge processed.   If you are having a true medical emergency please call 911.      For an urgent face to face visit, [place] has five urgent care centers for your convenience:      NEW:  [facility] at [facility] Directions [phone] [address] [place], [place] [address] . 10 am - 6pm Monday - Friday    [facility]) Get Driving Directions [phone] [address] [place], [place] [address] . 10 am to 8 pm Monday-Friday . 12 pm to 8 pm [facility] Urgent Care at [facility] Get Driving Directions [phone] [address] [place], [place] [address] . 8 am to 8 pm Monday-Friday . 9 am to 6 pm Saturday . 11 am to 6 pm Sunday     [facility] Urgent Care at [facility] Get Driving Directions  [phone] [address] [place], [place] [address] . 8 am to 8 pm Monday-Friday . 8 am to 4 pm [facility]gent Care at [facility] Directions [phone] [address] [place], [place] [address] . 12 pm to 6 pm Monday-Friday      Your e-visit answers were reviewed by a board certified advanced clinical practitioner to complete your personal care plan.  Thank you for using e-Visits.

## 2019-11-30 NOTE — Telephone Encounter (Signed)
Patient has been scheduled to come in to see Dr. Evans.  

## 2019-11-30 NOTE — Telephone Encounter (Signed)
Sent in secure chat from [facility], I had this patient message me via MyChart wanting to come in because she was experiencing some crampng. Patient thinks it could be linked to a UTI she had about 3 weeks ago, she has already taken a round of antiobiotics for this. Patient stated her dysuria was gone, however she is still having some cramping. Patient asked if she could come in today and drop off a urine sample? Would you be okay with this? If not we have already scheduled her for next Tuesday.   I tried to call patient to schedule with Dr. Logan Bores today at 3 PM. Patient has not been seen in the clinic since 2019 so will need a visit. LM for patient to return call.

## 2019-12-02 LAB — URINE CULTURE: Organism ID, Bacteria: NO GROWTH

## 2019-12-05 ENCOUNTER — Encounter: Payer: Self-pay | Admitting: Obstetrics and Gynecology

## 2020-01-09 ENCOUNTER — Other Ambulatory Visit: Payer: Self-pay

## 2020-01-09 ENCOUNTER — Other Ambulatory Visit (HOSPITAL_COMMUNITY)
Admission: RE | Admit: 2020-01-09 | Discharge: 2020-01-09 | Disposition: A | Discharge status: Home / Self Care | Payer: Commercial Managed Care - PPO | Source: Ambulatory Visit | Attending: Obstetrics and Gynecology | Admitting: Obstetrics and Gynecology

## 2020-01-09 ENCOUNTER — Encounter: Payer: Self-pay | Admitting: Obstetrics and Gynecology

## 2020-01-09 ENCOUNTER — Ambulatory Visit (INDEPENDENT_AMBULATORY_CARE_PROVIDER_SITE_OTHER): Payer: Commercial Managed Care - PPO | Admitting: Obstetrics and Gynecology

## 2020-01-09 VITALS — BP 115/80 | HR 96 | Ht 61.0 in | Wt 152.9 lb

## 2020-01-09 DIAGNOSIS — N871 Moderate cervical dysplasia: Secondary | ICD-10-CM | POA: Diagnosis not present

## 2020-01-09 DIAGNOSIS — Z01419 Encounter for gynecological examination (general) (routine) without abnormal findings: Secondary | ICD-10-CM | POA: Diagnosis not present

## 2020-01-09 NOTE — Addendum Note (Signed)
Addended by: Dorian Pod on: 01/09/2020 01:52 PM   Modules accepted: Orders

## 2020-01-09 NOTE — Progress Notes (Signed)
HPI:      [name] is a 34 y.o. G1P0 who LMP was No LMP recorded (lmp unknown). (Menstrual status: IUD).  Subjective:   She presents today for her annual examination.  She has no complaints.  She still has occasional pelvic cramping but it is "slowly improving." Patient has a history of CIN-2 status post LEEP first 2 Paps after LEEP were normal. Long history of tobacco abuse but patient has quit and has remained off cigarettes for more than 1 year. Cardiac stents in place. IUD for birth control. Patient gets lab work through her primary care physician. No family history of breast cancer.    Hx: The following portions of the patient's history were reviewed and updated as appropriate:             She  has a past medical history of Amenorrhea, GERD (gastroesophageal reflux disease), Gestational diabetes, Heart attack ([facility]) (10/07/2018), PCOS (polycystic ovarian syndrome), and Thyroid disease. She does not have any pertinent problems on file. She  has a past surgical history that includes none and Coronary angioplasty with stent. Her family history includes Diabetes in her maternal grandfather; Heart failure in her father; Stroke in her maternal grandmother; Thyroid disease in her maternal grandmother and mother. She  reports that she quit smoking about 15 months ago. Her smoking use included cigarettes. She smoked 0.25 packs per day. She has never used smokeless tobacco. She reports current alcohol use. She reports that she does not use drugs. She has a current medication list which includes the following prescription(s): levonorgestrel, metformin, omeprazole, and docusate sodium. She has No Known Allergies.       Review of Systems:  Review of Systems  Constitutional: Denied constitutional symptoms, night sweats, recent illness, fatigue, fever, insomnia and weight loss.  Eyes: Denied eye symptoms, eye pain, photophobia, vision change and visual disturbance.  Ears/Nose/Throat/Neck:  Denied ear, nose, throat or neck symptoms, hearing loss, nasal discharge, sinus congestion and sore throat.  Cardiovascular: Denied cardiovascular symptoms, arrhythmia, chest pain/pressure, edema, exercise intolerance, orthopnea and palpitations.  Respiratory: Denied pulmonary symptoms, asthma, pleuritic pain, productive sputum, cough, dyspnea and wheezing.  Gastrointestinal: Denied, gastro-esophageal reflux, melena, nausea and vomiting.  Genitourinary: Denied genitourinary symptoms including symptomatic vaginal discharge, pelvic relaxation issues, and urinary complaints.  Musculoskeletal: Denied musculoskeletal symptoms, stiffness, swelling, muscle weakness and myalgia.  Dermatologic: Denied dermatology symptoms, rash and scar.  Neurologic: Denied neurology symptoms, dizziness, headache, neck pain and syncope.  Psychiatric: Denied psychiatric symptoms, anxiety and depression.  Endocrine: Denied endocrine symptoms including hot flashes and night sweats.   Meds:   Current Outpatient Medications on File Prior to Visit  Medication Sig Dispense Refill  . levonorgestrel (MIRENA) 20 MCG/24HR IUD 1 each by Intrauterine route once.    . metFORMIN (GLUCOPHAGE) 500 MG tablet Take by mouth 2 (two) times daily with a meal.    . omeprazole (PRILOSEC) 20 MG capsule Take 20 mg by mouth daily.    Marland Kitchen docusate sodium (COLACE) 100 MG capsule Take 200 mg by mouth daily.     No current facility-administered medications on file prior to visit.    Objective:     Vitals:   01/09/20 1103  BP: 115/80  Pulse: 96              Physical examination General NAD, Conversant  HEENT Atraumatic; Op clear with mmm.  Normo-cephalic. Pupils reactive. Anicteric sclerae  Thyroid/Neck Smooth without nodularity or enlargement. Normal ROM.  Neck Supple.  Skin No  rashes, lesions or ulceration. Normal palpated skin turgor. No nodularity.  Breasts: No masses or discharge.  Symmetric.  No axillary adenopathy.  Lungs: Clear to  auscultation.No rales or wheezes. Normal Respiratory effort, no retractions.  Heart: NSR.  No murmurs or rubs appreciated. No periferal edema  Abdomen: Soft.  Non-tender.  No masses.  No HSM. No hernia  Extremities: Moves all appropriately.  Normal ROM for age. No lymphadenopathy.  Neuro: Oriented to PPT.  Normal mood. Normal affect.     Pelvic:   Vulva: Normal appearance.  No lesions.  Vagina: No lesions or abnormalities noted.  Support: Normal pelvic support.  Urethra No masses tenderness or scarring.  Meatus Normal size without lesions or prolapse.  Cervix: Normal appearance.  No lesions.  Obviously status post procedure.  IUD strings noted  Anus: Normal exam.  No lesions.  Perineum: Normal exam.  No lesions.        Bimanual   Uterus: Normal size.  Non-tender.  Mobile.  AV.  Adnexae: No masses.  Non-tender to palpation.  Cul-de-sac: Negative for abnormality.      Assessment:    G1P0 Patient Active Problem List   Diagnosis Date Noted  . Preterm labor 03/26/2017  . Polyhydramnios in third trimester 03/08/2017  . Hemorrhoids during pregnancy in third trimester 03/02/2017  . Gestational diabetes 12/15/2016  . Thyroid disease affecting pregnancy 12/12/2016  . PCOS (polycystic ovarian syndrome)   . Functional constipation 11/16/2013  . IBS (irritable bowel syndrome) 11/16/2013     1. Well woman exam with routine gynecological exam   2. CIN II (cervical intraepithelial neoplasia II)        Plan:            1.  Basic Screening Recommendations The basic screening recommendations for asymptomatic women were discussed with the patient during her visit.  The age-appropriate recommendations were discussed with her and the rational for the tests reviewed.  When I am informed by the patient that another primary care physician has previously obtained the age-appropriate tests and they are up-to-date, only outstanding tests are ordered and referrals given as necessary.  Abnormal  results of tests will be discussed with her when all of her results are completed.  Routine preventative health maintenance measures emphasized: Exercise/Diet/Weight control, Tobacco Warnings, Alcohol/Substance use risks and Stress Management Pap cotest performed-if both negative moved to cotest every 3 years. Orders No orders of the defined types were placed in this encounter.   No orders of the defined types were placed in this encounter.       F/U  Return in about 1 year (around 01/08/2021) for Annual Physical.  Finis Bud, M.D. 01/09/2020 11:21 AM

## 2020-01-12 LAB — CYTOLOGY - PAP
Comment: NEGATIVE
Diagnosis: UNDETERMINED — AB
High risk HPV: NEGATIVE

## 2020-12-13 ENCOUNTER — Inpatient Hospital Stay: Payer: Commercial Managed Care - PPO | Attending: Oncology | Admitting: Oncology

## 2020-12-13 ENCOUNTER — Inpatient Hospital Stay: Payer: Commercial Managed Care - PPO

## 2020-12-13 ENCOUNTER — Encounter: Payer: Self-pay | Admitting: Oncology

## 2020-12-13 VITALS — BP 137/94 | HR 78 | Temp 98.3°F | Resp 16 | Wt 161.4 lb

## 2020-12-13 DIAGNOSIS — Z8249 Family history of ischemic heart disease and other diseases of the circulatory system: Secondary | ICD-10-CM | POA: Diagnosis not present

## 2020-12-13 DIAGNOSIS — Z79899 Other long term (current) drug therapy: Secondary | ICD-10-CM | POA: Insufficient documentation

## 2020-12-13 DIAGNOSIS — Z833 Family history of diabetes mellitus: Secondary | ICD-10-CM | POA: Insufficient documentation

## 2020-12-13 DIAGNOSIS — Z7982 Long term (current) use of aspirin: Secondary | ICD-10-CM | POA: Insufficient documentation

## 2020-12-13 DIAGNOSIS — Z8349 Family history of other endocrine, nutritional and metabolic diseases: Secondary | ICD-10-CM | POA: Diagnosis not present

## 2020-12-13 DIAGNOSIS — R7401 Elevation of levels of liver transaminase levels: Secondary | ICD-10-CM

## 2020-12-13 DIAGNOSIS — Z793 Long term (current) use of hormonal contraceptives: Secondary | ICD-10-CM | POA: Insufficient documentation

## 2020-12-13 DIAGNOSIS — Z87891 Personal history of nicotine dependence: Secondary | ICD-10-CM | POA: Insufficient documentation

## 2020-12-13 DIAGNOSIS — I252 Old myocardial infarction: Secondary | ICD-10-CM | POA: Diagnosis not present

## 2020-12-13 DIAGNOSIS — Z148 Genetic carrier of other disease: Secondary | ICD-10-CM

## 2020-12-13 DIAGNOSIS — Z7984 Long term (current) use of oral hypoglycemic drugs: Secondary | ICD-10-CM | POA: Diagnosis not present

## 2020-12-13 NOTE — Progress Notes (Signed)
Hematology/Oncology Consult note [facility] Telephone:([phone] [phone]   Patient Care Team: Lattie Haw, NP as PCP - General (Internal Medicine)  REFERRING PROVIDER: Ronal Fear, NP  CHIEF COMPLAINTS/REASON FOR VISIT:  Evaluation of hemochromatosis   HISTORY OF PRESENTING ILLNESS:   Holly Villa is a  35 y.o.  female with PMH listed below was seen in consultation at the request of  Shayne Alken Tawny Asal, NP  for evaluation of hemochromatosis. Patient's father has hemochromatosis and he gets frequent phlebotomy sessions.  Due to the family history of hemochromatosis, patient recently had blood work done at primary care provider's office. Medical records were reviewed. 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 Alkaline phosphatase 129, AST 77, ALT 156.  Bilirubin 0.11. Ferritin 43 Transferrin 300  Patient reports that she has chronic transaminitis due to being on cholesterol medication.  10/07/2018 patient has a history of myocardial infarction status post drug-eluting stent.  Patient is on Zetia, Crestor. She has a Mirena IUD.  LMP 3 years ago. Patient denies any complaints today.  Accompanied by mother. 09/03/2016, patient had US liver done at [facility] which showed fatty liver disease She drinks alcohol socially.  Review of Systems  Constitutional: Negative for appetite change, chills, fatigue and fever.  HENT:   Negative for hearing loss and voice change.   Eyes: Negative for eye problems.  Respiratory: Negative for chest tightness and cough.   Cardiovascular: Negative for chest pain.  Gastrointestinal: Negative for abdominal distention, abdominal pain and blood in stool.  Endocrine: Negative for hot flashes.  Genitourinary: Negative for difficulty urinating and frequency.   Musculoskeletal: Negative for arthralgias.  Skin: Negative for itching and rash.  Neurological: Negative for extremity  weakness.  Hematological: Negative for adenopathy.  Psychiatric/Behavioral: Negative for confusion.    MEDICAL HISTORY:  Past Medical History:  Diagnosis Date  . Amenorrhea   . GERD (gastroesophageal reflux disease)   . Gestational diabetes   . Heart attack (HCC) 10/07/2018  . PCOS (polycystic ovarian syndrome)    new diagnosis per pt.   . Thyroid disease     SURGICAL HISTORY: Past Surgical History:  Procedure Laterality Date  . CORONARY ANGIOPLASTY WITH STENT PLACEMENT    . none      SOCIAL HISTORY: Social History   Socioeconomic History  . Marital status: Married    Spouse name: Not on file  . Number of children: Not on file  . Years of education: Not on file  . Highest education level: Not on file  Occupational History  . Not on file  Tobacco Use  . Smoking status: Former Smoker    Packs/day: 0.25    Types: Cigarettes    Quit date: 10/07/2018    Years since quitting: 2.1  . Smokeless tobacco: Never Used  Vaping Use  . Vaping Use: Never used  Substance and Sexual Activity  . Alcohol use: Yes    Comment: rare  . Drug use: No  . Sexual activity: Yes    Partners: Male    Birth control/protection: None, I.U.D.  Other Topics Concern  . Not on file  Social History Narrative  . Not on file   Social Determinants of Health   Financial Resource Strain: Not on file  Food Insecurity: Not on file  Transportation Needs: Not on file  Physical Activity: Not on file  Stress: Not on file  Social Connections: Not on file  Intimate Partner Violence: Not on file    FAMILY HISTORY:  Family History  Problem Relation Age of Onset  . Thyroid disease Mother   . Heart failure Father   . Stroke Maternal Grandmother   . Thyroid disease Maternal Grandmother   . Diabetes Maternal Grandfather     ALLERGIES:  has No Known Allergies.  MEDICATIONS:  Current Outpatient Medications  Medication Sig Dispense Refill  . aspirin 81 MG chewable tablet Chew by mouth.    .  clonazePAM (KLONOPIN) 0.5 MG disintegrating tablet Take by mouth.    . DULoxetine (CYMBALTA) 60 MG capsule Take 60 mg by mouth daily.    Marland Kitchen ezetimibe (ZETIA) 10 MG tablet TAKE 1 TABLET BY MOUTH EVERY DAY AT NIGHT    . levonorgestrel (MIRENA) 20 MCG/24HR IUD 1 each by Intrauterine route once.    . metFORMIN (GLUCOPHAGE) 500 MG tablet Take by mouth 2 (two) times daily with a meal.    . metoprolol succinate (TOPROL-XL) 25 MG 24 hr tablet Take 1 tablet by mouth daily.    Marland Kitchen omeprazole (PRILOSEC) 40 MG capsule Take 1 capsule by mouth daily.    . rosuvastatin (CRESTOR) 40 MG tablet Take 20 mg by mouth daily.    Marland Kitchen docusate sodium (COLACE) 100 MG capsule Take 200 mg by mouth daily. (Patient not taking: Reported on 12/13/2020)    . empagliflozin (JARDIANCE) 10 MG TABS tablet Take by mouth. (Patient not taking: Reported on 12/13/2020)    . escitalopram (LEXAPRO) 20 MG tablet Take 1 tablet by mouth daily. (Patient not taking: Reported on 12/13/2020)    . gabapentin (NEURONTIN) 100 MG capsule Take 1 capsule by mouth 2 (two) times daily. (Patient not taking: Reported on 12/13/2020)    . glucose blood (PRECISION QID TEST) test strip Use 1 each (1 strip total) 3 (three) times daily Use as instructed. (Patient not taking: Reported on 12/13/2020)    . nitroGLYCERIN (NITROSTAT) 0.4 MG SL tablet  (Patient not taking: Reported on 12/13/2020)    . omeprazole (PRILOSEC) 20 MG capsule Take 20 mg by mouth daily. (Patient not taking: Reported on 12/13/2020)     No current facility-administered medications for this visit.     PHYSICAL EXAMINATION: ECOG PERFORMANCE STATUS: 0 - Asymptomatic Vitals:   12/13/20 1106  BP: (!) 137/94  Pulse: 78  Resp: 16  Temp: 98.3 F (36.8 C)  SpO2: 98%   Filed Weights   12/13/20 1106  Weight: 161 lb 6.4 oz (73.2 kg)    Physical Exam  LABORATORY DATA:  I have reviewed the data as listed Lab Results  Component Value Date   WBC 23.9 (H) 03/26/2017   HGB 12.6 03/26/2017   HCT 37.3  03/26/2017   MCV 86.0 03/26/2017   PLT 392 03/26/2017   No results for input(s): NA, K, CL, CO2, GLUCOSE, BUN, CREATININE, CALCIUM, GFRNONAA, GFRAA, PROT, ALBUMIN, AST, ALT, ALKPHOS, BILITOT, BILIDIR, IBILI in the last 8760 hours. Iron/TIBC/Ferritin/ %Sat No results found for: IRON, TIBC, FERRITIN, IRONPCTSAT    RADIOGRAPHIC STUDIES: I have personally reviewed the radiological images as listed and agreed with the findings in the report. No results found.    ASSESSMENT & PLAN:  1. Transaminitis   2. 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  Advised patient to have first-degree relative screening for hemochromatosis.  Avoid alcohol consumption.Recommend to avoid oral iron supplementation, vitamin C supplementation. Her recent labs at primary care provider's office is not consistent with iron overload.  Ferritin is 45. Recommend observation.  Majority patients with heterozygous  hemochromatosis gene mutation do not develop iron overload.  Transaminitis, chronic.  Likely secondary to her cholesterol pills. Recommend her to obtain ultrasound right upper quadrant for further evaluation of liver.  Orders Placed This Encounter  Procedures  . US Abdomen Limited RUQ (LIVER/GB)    Standing Status:   Future    Standing Expiration Date:   12/13/2021    Order Specific Question:   Reason for Exam (SYMPTOM  OR DIAGNOSIS REQUIRED)    Answer:   Transaminitis    Order Specific Question:   Preferred imaging location?    Answer:   [facility]    All questions were answered. The patient knows to call the clinic with any problems questions or concerns.  cc Ronal Fear, NP    Return of visit: 1 year Thank you for this kind referral and the opportunity to participate in the care of this patient. A copy of today's note is routed to referring provider    Rickard Patience, MD, PhD Hematology Oncology [facility] Pager- [phone] 12/13/2020

## 2020-12-13 NOTE — Addendum Note (Signed)
Addended by: Guerry Minors on: 12/13/2020 01:21 PM   Modules accepted: Orders

## 2021-01-15 ENCOUNTER — Other Ambulatory Visit: Payer: Self-pay

## 2021-01-15 ENCOUNTER — Other Ambulatory Visit (HOSPITAL_COMMUNITY)
Admission: RE | Admit: 2021-01-15 | Discharge: 2021-01-15 | Disposition: A | Discharge status: Home / Self Care | Payer: Commercial Managed Care - PPO | Source: Ambulatory Visit | Attending: Obstetrics and Gynecology | Admitting: Obstetrics and Gynecology

## 2021-01-15 ENCOUNTER — Encounter: Payer: Self-pay | Admitting: Obstetrics and Gynecology

## 2021-01-15 ENCOUNTER — Ambulatory Visit (INDEPENDENT_AMBULATORY_CARE_PROVIDER_SITE_OTHER): Payer: Commercial Managed Care - PPO | Admitting: Obstetrics and Gynecology

## 2021-01-15 VITALS — BP 119/85 | HR 88 | Ht 61.0 in | Wt 159.2 lb

## 2021-01-15 DIAGNOSIS — Z9889 Other specified postprocedural states: Secondary | ICD-10-CM

## 2021-01-15 DIAGNOSIS — Z01419 Encounter for gynecological examination (general) (routine) without abnormal findings: Secondary | ICD-10-CM | POA: Diagnosis not present

## 2021-01-15 NOTE — Progress Notes (Signed)
HPI:      [name] is a 35 y.o. G1P1001 who LMP was No LMP recorded (lmp unknown). (Menstrual status: IUD).  Subjective:   She presents today for her annual examination.  She has no complaints.  She has now remained off tobacco/smoking for greater than 2 years!! She does have heart stents, diabetes, and hypertension.  She says that she was recently diagnosed with hemochromatosis. She has a remote history of CIN-2 and LEEP.  She has had 2 normal Pap smears and her Pap smear last year showed ASCUS but was negative for HPV.  She has chosen to have a Pap performed again this year. She has an IUD (2018) and is not having menstrual periods    Hx: The following portions of the patient's history were reviewed and updated as appropriate:             She  has a past medical history of Amenorrhea, Anxiety, GERD (gastroesophageal reflux disease), Gestational diabetes, Heart attack (HCC) (10/07/2018), PCOS (polycystic ovarian syndrome), and Thyroid disease. She does not have any pertinent problems on file. She  has a past surgical history that includes none and Coronary angioplasty with stent. Her family history includes Diabetes in her maternal grandfather; Heart failure in her father; Liver cancer in her father; Stroke in her maternal grandmother; Thyroid disease in her maternal grandmother and mother. She  reports that she quit smoking about 2 years ago. Her smoking use included cigarettes. She smoked 0.25 packs per day. She has never used smokeless tobacco. She reports current alcohol use. She reports that she does not use drugs. She has a current medication list which includes the following prescription(s): aspirin, clonazepam, ezetimibe, levonorgestrel, metformin, metoprolol succinate, omeprazole, and rosuvastatin. She has No Known Allergies.       Review of Systems:  Review of Systems  Constitutional: Denied constitutional symptoms, night sweats, recent illness, fatigue, fever, insomnia and  weight loss.  Eyes: Denied eye symptoms, eye pain, photophobia, vision change and visual disturbance.  Ears/Nose/Throat/Neck: Denied ear, nose, throat or neck symptoms, hearing loss, nasal discharge, sinus congestion and sore throat.  Cardiovascular: Denied cardiovascular symptoms, arrhythmia, chest pain/pressure, edema, exercise intolerance, orthopnea and palpitations.  Respiratory: Denied pulmonary symptoms, asthma, pleuritic pain, productive sputum, cough, dyspnea and wheezing.  Gastrointestinal: Denied, gastro-esophageal reflux, melena, nausea and vomiting.  Genitourinary: Denied genitourinary symptoms including symptomatic vaginal discharge, pelvic relaxation issues, and urinary complaints.  Musculoskeletal: Denied musculoskeletal symptoms, stiffness, swelling, muscle weakness and myalgia.  Dermatologic: Denied dermatology symptoms, rash and scar.  Neurologic: Denied neurology symptoms, dizziness, headache, neck pain and syncope.  Psychiatric: Denied psychiatric symptoms, anxiety and depression.  Endocrine: Denied endocrine symptoms including hot flashes and night sweats.   Meds:   Current Outpatient Medications on File Prior to Visit  Medication Sig Dispense Refill  . aspirin 81 MG chewable tablet Chew by mouth.    . clonazePAM (KLONOPIN) 0.5 MG disintegrating tablet Take by mouth.    . ezetimibe (ZETIA) 10 MG tablet TAKE 1 TABLET BY MOUTH EVERY DAY AT NIGHT    . levonorgestrel (MIRENA) 20 MCG/24HR IUD 1 each by Intrauterine route once.    . metFORMIN (GLUCOPHAGE) 500 MG tablet Take by mouth 2 (two) times daily with a meal.    . metoprolol succinate (TOPROL-XL) 25 MG 24 hr tablet Take 1 tablet by mouth daily.    Marland Kitchen omeprazole (PRILOSEC) 40 MG capsule Take 1 capsule by mouth daily.    . rosuvastatin (CRESTOR) 20 MG tablet Take  20 mg by mouth daily.     No current facility-administered medications on file prior to visit.       Upstream - 01/15/21 1120      Pregnancy Intention  Screening   Does the patient want to become pregnant in the next year? No    Does the patient's partner want to become pregnant in the next year? No    Would the patient like to discuss contraceptive options today? No      Contraception Wrap Up   Current Method IUD or IUS    End Method IUD or IUS          The pregnancy intention screening data noted above was reviewed. Potential methods of contraception were discussed. The patient elected to proceed with IUD or IUS.     Objective:     Vitals:   01/15/21 1111  BP: 119/85  Pulse: 88    Filed Weights   01/15/21 1111  Weight: 159 lb 3.2 oz (72.2 kg)              Physical examination General NAD, Conversant  HEENT Atraumatic; Op clear with mmm.  Normo-cephalic. Pupils reactive. Anicteric sclerae  Thyroid/Neck Smooth without nodularity or enlargement. Normal ROM.  Neck Supple.  Skin No rashes, lesions or ulceration. Normal palpated skin turgor. No nodularity.  Breasts: No masses or discharge.  Symmetric.  No axillary adenopathy.  Lungs: Clear to auscultation.No rales or wheezes. Normal Respiratory effort, no retractions.  Heart: NSR.  No murmurs or rubs appreciated. No periferal edema  Abdomen: Soft.  Non-tender.  No masses.  No HSM. No hernia  Extremities: Moves all appropriately.  Normal ROM for age. No lymphadenopathy.  Neuro: Oriented to PPT.  Normal mood. Normal affect.     Pelvic:   Vulva: Normal appearance.  No lesions.  Vagina: No lesions or abnormalities noted.  Support: Normal pelvic support.  Urethra No masses tenderness or scarring.  Meatus Normal size without lesions or prolapse.  Cervix: Normal appearance.  No lesions.  IUD strings noted  Anus: Normal exam.  No lesions.  Perineum: Normal exam.  No lesions.        Bimanual   Uterus: Normal size.  Non-tender.  Mobile.  AV.  Adnexae: No masses.  Non-tender to palpation.  Cul-de-sac: Negative for abnormality.      Assessment:    G1P1001 Patient Active  Problem List   Diagnosis Date Noted  . Adjustment disorder with mixed anxiety and depressed mood 11/07/2018  . S/P drug eluting coronary stent placement 10/12/2018  . Type II diabetes mellitus (HCC) 10/08/2018  . Hyperlipidemia 10/08/2018  . GERD (gastroesophageal reflux disease) 10/08/2018  . 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 Tobacco use disorder 10/08/2018  . Preterm labor 03/26/2017  . Polyhydramnios in third trimester 03/08/2017  . Hemorrhoids during pregnancy in third trimester 03/02/2017  . Gestational diabetes 12/15/2016  . Thyroid disease affecting pregnancy 12/12/2016  . PCOS (polycystic ovarian syndrome)   . Functional constipation 11/16/2013  . IBS (irritable bowel syndrome) 11/16/2013     1. Well woman exam with routine gynecological exam   2. History of loop electrical excision procedure (LEEP)        Plan:            1.  Basic Screening Recommendations The basic screening recommendations for asymptomatic women were discussed with the patient during her visit.  The age-appropriate recommendations were discussed with her and the rational for the tests reviewed.  When I am informed by the patient that another primary care physician has previously obtained the age-appropriate tests and they are up-to-date, only outstanding tests are ordered and referrals given as necessary.  Abnormal results of tests will be discussed with her when all of her results are completed.  Routine preventative health maintenance measures emphasized: Exercise/Diet/Weight control, Tobacco Warnings, Alcohol/Substance use risks and Stress Management Pap Co-test 2.  Strongly encourage better compliance with glycemic control 3.  Encouraged patient with cessation of tobacco products  Orders No orders of the defined types were placed in this encounter.   No orders of the defined types were placed in this encounter.         F/U  Return in about 1 year (around 01/15/2022)  for Annual Physical.  Elonda Husky, M.D. 01/15/2021 11:53 AM

## 2021-01-20 LAB — CYTOLOGY - PAP
Comment: NEGATIVE
Diagnosis: NEGATIVE
High risk HPV: NEGATIVE

## 2021-01-23 ENCOUNTER — Ambulatory Visit: Payer: Commercial Managed Care - PPO

## 2021-02-24 ENCOUNTER — Ambulatory Visit: Payer: Commercial Managed Care - PPO

## 2021-03-21 ENCOUNTER — Ambulatory Visit
Admission: RE | Admit: 2021-03-21 | Discharge: 2021-03-21 | Disposition: A | Discharge status: Home / Self Care | Payer: Commercial Managed Care - PPO | Source: Ambulatory Visit | Attending: Oncology | Admitting: Oncology

## 2021-03-21 ENCOUNTER — Other Ambulatory Visit: Payer: Self-pay

## 2021-03-21 DIAGNOSIS — R7401 Elevation of levels of liver transaminase levels: Secondary | ICD-10-CM | POA: Insufficient documentation

## 2021-03-27 ENCOUNTER — Telehealth: Payer: Self-pay

## 2021-03-27 NOTE — Telephone Encounter (Signed)
Patient has been notified and voiced understanding.

## 2021-03-27 NOTE — Telephone Encounter (Signed)
-----   Message from Rickard Patience, MD sent at 03/27/2021  1:44 PM EDT ----- Please let patient know that she has fatty liver disease. Recommend her to further discuss with primary care physician, or being referred to see gastroenterology . Thanks.

## 2021-12-10 ENCOUNTER — Other Ambulatory Visit: Payer: Commercial Managed Care - PPO

## 2021-12-12 ENCOUNTER — Ambulatory Visit: Payer: Commercial Managed Care - PPO | Admitting: Oncology

## 2021-12-12 IMAGING — US US ABDOMEN LIMITED
1 series · 14 of 25 positions shown · non-contrast
Comparison: None.

CLINICAL DATA: Transaminitis.

EXAM:
ULTRASOUND ABDOMEN LIMITED RIGHT UPPER QUADRANT

[Series 1: us abdomen limited ruq (liver/gb) · 14 of 48 slices shown]
[im 1/48]
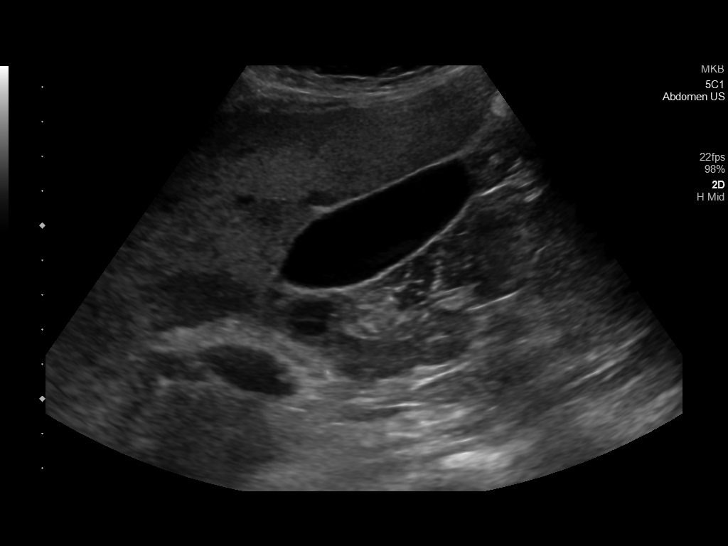
[im 4/48]
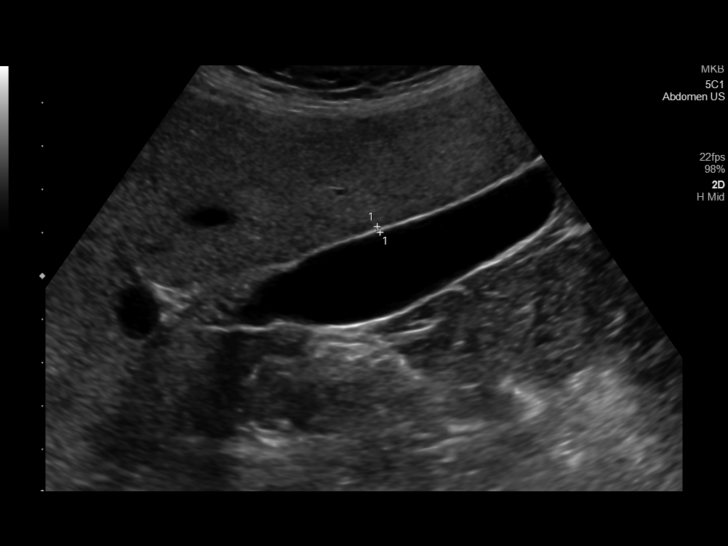
[im 8/48]
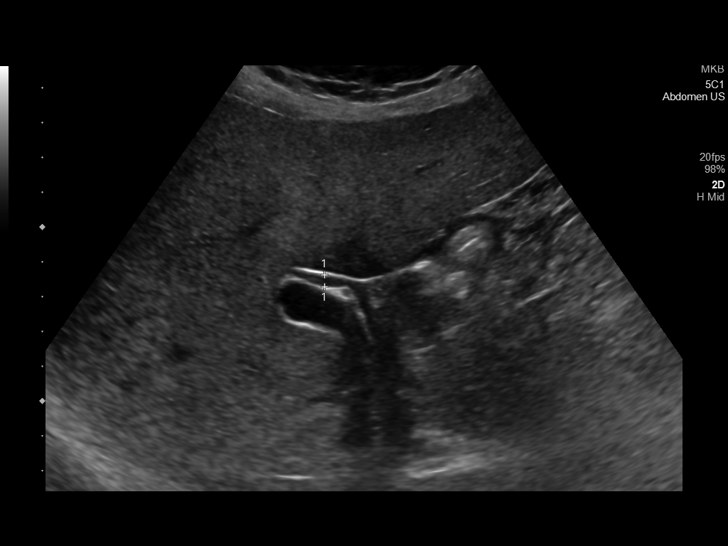
[im 12/48]
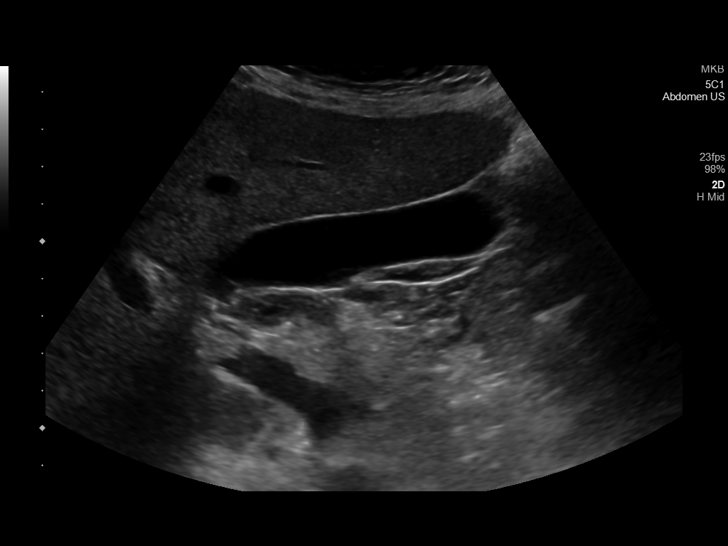
[im 16/48]
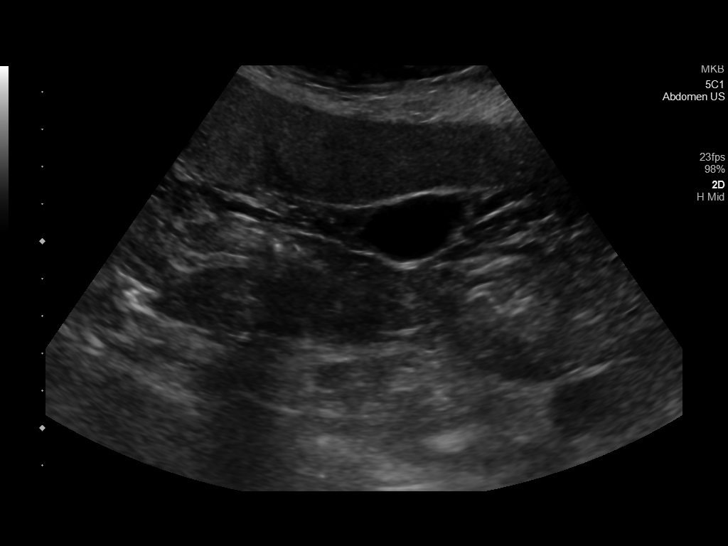
[im 18/48]
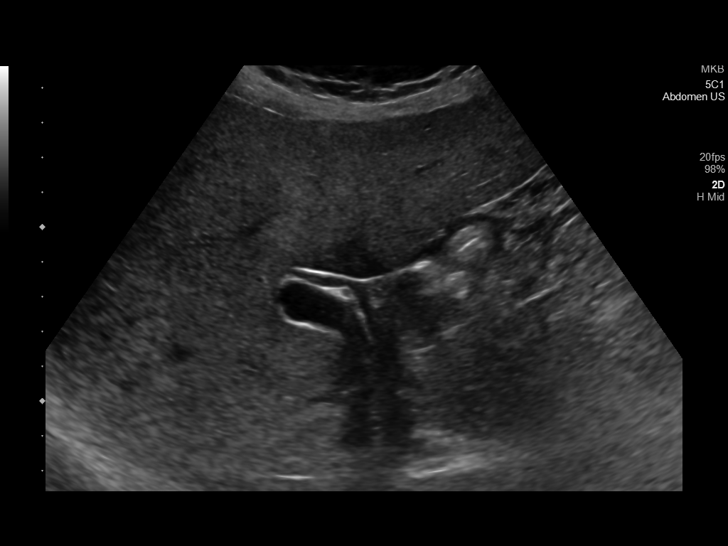
[im 22/48]
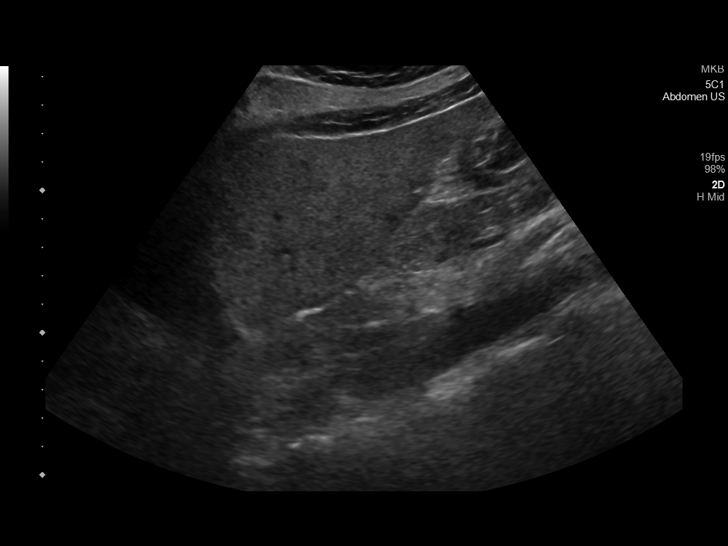
[im 26/48]
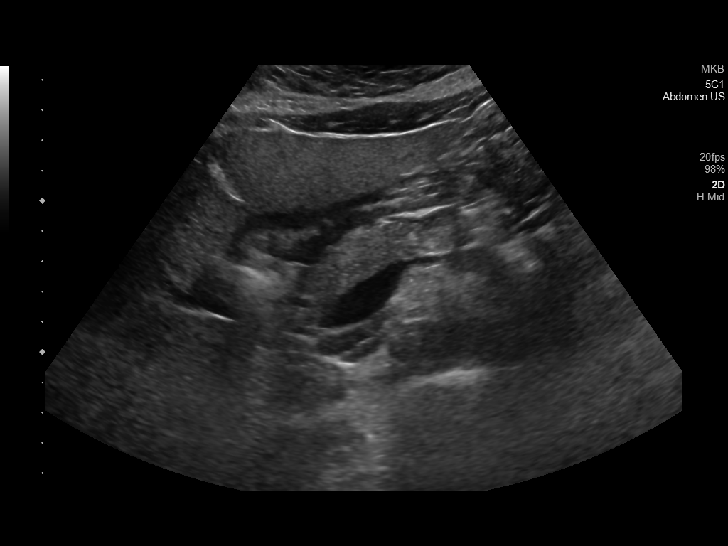
[im 30/48]
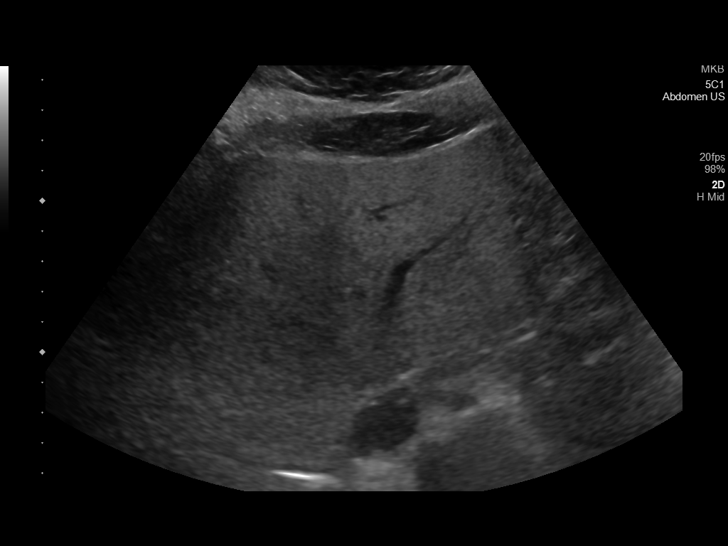
[im 32/48]
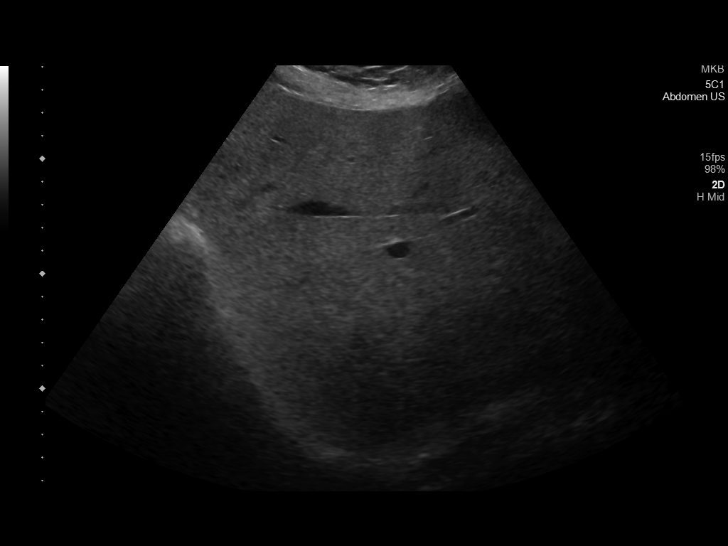
[im 36/48]
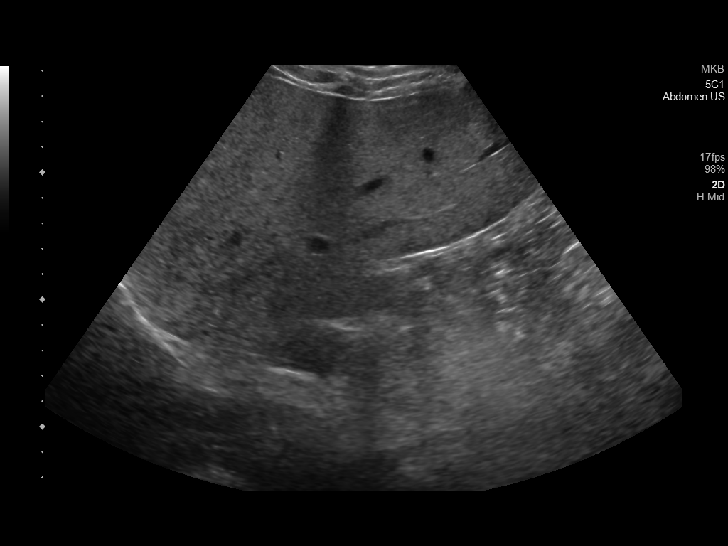
[im 40/48]
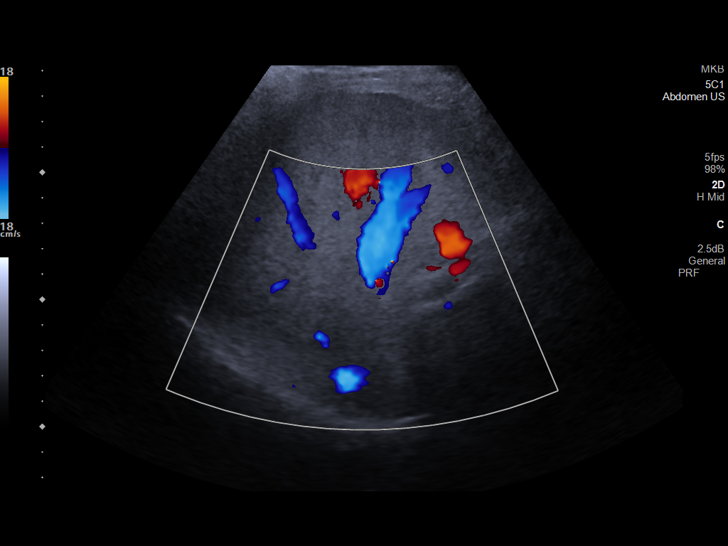
[im 44/48]
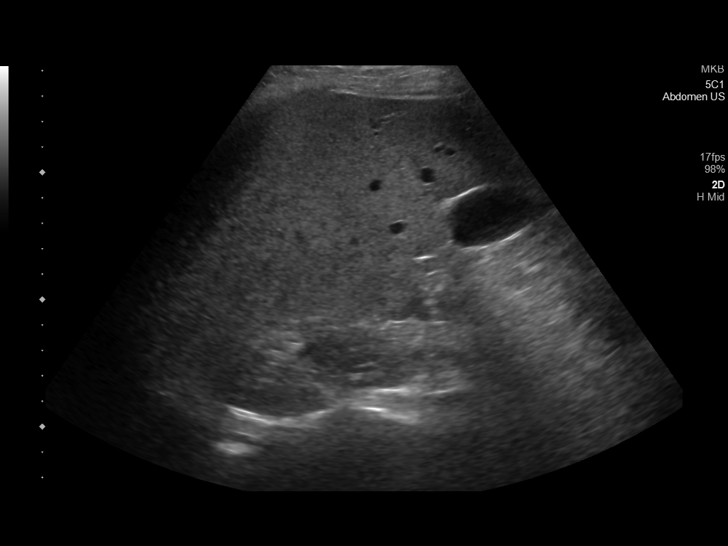
[im 48/48]
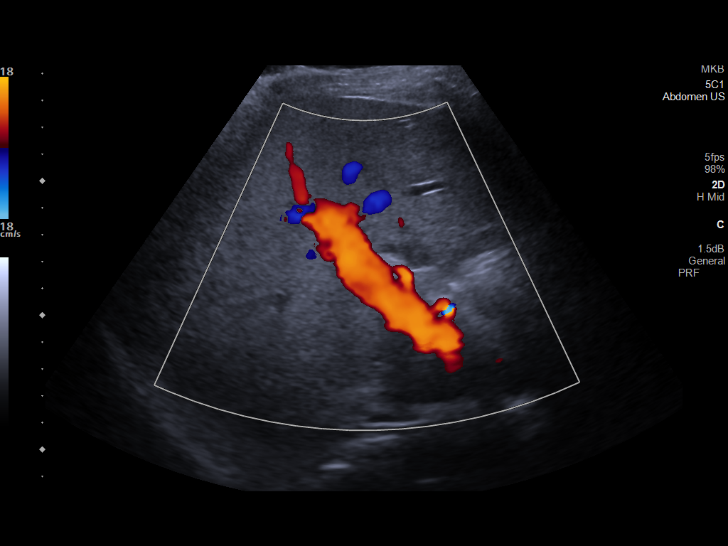

[14 of 25 positions shown; findings below may reference images not displayed]

FINDINGS: Gallbladder:

No gallstones or wall thickening visualized (1.5 mm). No sonographic
Murphy sign noted by sonographer.

Common bile duct:

Diameter: 3.5 mm

Liver:

No focal lesion identified. Diffusely increased echogenicity of the
liver parenchyma is noted. Portal vein is patent on color Doppler
imaging with normal direction of blood flow towards the liver.

Other: None.
IMPRESSION: Fatty liver.

## 2022-01-16 ENCOUNTER — Encounter: Payer: Commercial Managed Care - PPO | Admitting: Obstetrics and Gynecology

## 2022-02-18 ENCOUNTER — Encounter: Payer: Self-pay | Admitting: Obstetrics and Gynecology

## 2022-02-18 ENCOUNTER — Ambulatory Visit (INDEPENDENT_AMBULATORY_CARE_PROVIDER_SITE_OTHER): Payer: Commercial Managed Care - PPO | Admitting: Obstetrics and Gynecology

## 2022-02-18 VITALS — BP 118/84 | HR 98 | Ht 61.0 in | Wt 145.4 lb

## 2022-02-18 DIAGNOSIS — Z01419 Encounter for gynecological examination (general) (routine) without abnormal findings: Secondary | ICD-10-CM | POA: Diagnosis not present

## 2022-02-18 NOTE — Progress Notes (Signed)
Patients presents for annual exam today. She states no problems or concerns with IUD. Patient is up to date on pap smear. Labs deferred at this time.  Patient states no other questions or concerns at this time.

## 2022-02-18 NOTE — Progress Notes (Signed)
HPI:      Holly Villa is a 36 y.o. G1P1001 who LMP was No LMP recorded. (Menstrual status: IUD).  Subjective:   She presents today for her annual examination.  She has no complaints.  She is not having menses with her IUD.  Generally feels well. Unfortunately, she has restarted smoking occasionally.    Hx: The following portions of the patient's history were reviewed and updated as appropriate:             She  has a past medical history of Amenorrhea, Anxiety, GERD (gastroesophageal reflux disease), Gestational diabetes, Heart attack (HCC) (10/07/2018), PCOS (polycystic ovarian syndrome), and Thyroid disease. She does not have any pertinent problems on file. She  has a past surgical history that includes none and Coronary angioplasty with stent. Her family history includes Diabetes in her maternal grandfather; Heart failure in her father; Liver cancer in her father; Stroke in her maternal grandmother; Thyroid disease in her maternal grandmother and mother. She  reports that she has been smoking cigarettes. She has been smoking an average of .25 packs per day. She has never used smokeless tobacco. She reports that she does not currently use alcohol. She reports that she does not use drugs. She has a current medication list which includes the following prescription(s): aspirin, clonazepam, duloxetine, empagliflozin, ezetimibe, levonorgestrel, metformin, metoprolol succinate, omeprazole, and rosuvastatin. She has No Known Allergies.       Review of Systems:  Review of Systems  Constitutional: Denied constitutional symptoms, night sweats, recent illness, fatigue, fever, insomnia and weight loss.  Eyes: Denied eye symptoms, eye pain, photophobia, vision change and visual disturbance.  Ears/Nose/Throat/Neck: Denied ear, nose, throat or neck symptoms, hearing loss, nasal discharge, sinus congestion and sore throat.  Cardiovascular: Denied cardiovascular symptoms, arrhythmia, chest  pain/pressure, edema, exercise intolerance, orthopnea and palpitations.  Respiratory: Denied pulmonary symptoms, asthma, pleuritic pain, productive sputum, cough, dyspnea and wheezing.  Gastrointestinal: Denied, gastro-esophageal reflux, melena, nausea and vomiting.  Genitourinary: Denied genitourinary symptoms including symptomatic vaginal discharge, pelvic relaxation issues, and urinary complaints.  Musculoskeletal: Denied musculoskeletal symptoms, stiffness, swelling, muscle weakness and myalgia.  Dermatologic: Denied dermatology symptoms, rash and scar.  Neurologic: Denied neurology symptoms, dizziness, headache, neck pain and syncope.  Psychiatric: Denied psychiatric symptoms, anxiety and depression.  Endocrine: Denied endocrine symptoms including hot flashes and night sweats.   Meds:   Current Outpatient Medications on File Prior to Visit  Medication Sig Dispense Refill   aspirin 81 MG chewable tablet Chew by mouth.     clonazePAM (KLONOPIN) 0.5 MG disintegrating tablet Take by mouth.     DULoxetine (CYMBALTA) 60 MG capsule Take 90 mg by mouth daily.     Empagliflozin (JARDIANCE PO) Take by mouth.     ezetimibe (ZETIA) 10 MG tablet TAKE 1 TABLET BY MOUTH EVERY DAY AT NIGHT     levonorgestrel (MIRENA) 20 MCG/24HR IUD 1 each by Intrauterine route once.     metFORMIN (GLUCOPHAGE) 500 MG tablet Take by mouth 2 (two) times daily with a meal.     metoprolol succinate (TOPROL-XL) 25 MG 24 hr tablet Take 1 tablet by mouth daily.     omeprazole (PRILOSEC) 40 MG capsule Take 1 capsule by mouth daily.     rosuvastatin (CRESTOR) 20 MG tablet Take 20 mg by mouth daily.     No current facility-administered medications on file prior to visit.     Objective:     There were no vitals filed for this visit.  There  were no vitals filed for this visit.            Physical examination General NAD, Conversant  HEENT Atraumatic; Op clear with mmm.  Normo-cephalic. Pupils reactive. Anicteric  sclerae  Thyroid/Neck Smooth without nodularity or enlargement. Normal ROM.  Neck Supple.  Skin No rashes, lesions or ulceration. Normal palpated skin turgor. No nodularity.  Breasts: No masses or discharge.  Symmetric.  No axillary adenopathy.  Lungs: Clear to auscultation.No rales or wheezes. Normal Respiratory effort, no retractions.  Heart: NSR.  No murmurs or rubs appreciated. No periferal edema  Abdomen: Soft.  Non-tender.  No masses.  No HSM. No hernia  Extremities: Moves all appropriately.  Normal ROM for age. No lymphadenopathy.  Neuro: Oriented to PPT.  Normal mood. Normal affect.     Pelvic:   Vulva: Normal appearance.  No lesions.  Vagina: No lesions or abnormalities noted.  Support: Normal pelvic support.  Urethra No masses tenderness or scarring.  Meatus Normal size without lesions or prolapse.  Cervix: Normal appearance.  No lesions.  IUD strings noted at the cervical os  Anus: Normal exam.  No lesions.  Perineum: Normal exam.  No lesions.        Bimanual   Uterus: Normal size.  Non-tender.  Mobile.  AV.  Adnexae: No masses.  Non-tender to palpation.  Cul-de-sac: Negative for abnormality.     Assessment:    G1P1001 Patient Active Problem List   Diagnosis Date Noted   Adjustment disorder with mixed anxiety and depressed mood 11/07/2018   S/P drug eluting coronary stent placement 10/12/2018   Type II diabetes mellitus (HCC) 10/08/2018   Hyperlipidemia 10/08/2018   GERD (gastroesophageal reflux disease) 10/08/2018   CIN II (cervical intraepithelial neoplasia II) 10/08/2018   Tobacco use disorder 10/08/2018   Preterm labor 03/26/2017   Polyhydramnios in third trimester 03/08/2017   Hemorrhoids during pregnancy in third trimester 03/02/2017   Gestational diabetes 12/15/2016   Thyroid disease affecting pregnancy 12/12/2016   PCOS (polycystic ovarian syndrome)    Functional constipation 11/16/2013   IBS (irritable bowel syndrome) 11/16/2013     1. Well woman  exam with routine gynecological exam     Normal exam-patient doing well -IUD strings appropriately noted   Plan:            1.  Basic Screening Recommendations The basic screening recommendations for asymptomatic women were discussed with the patient during her visit.  The age-appropriate recommendations were discussed with her and the rational for the tests reviewed.  When I am informed by the patient that another primary care physician has previously obtained the age-appropriate tests and they are up-to-date, only outstanding tests are ordered and referrals given as necessary.  Abnormal results of tests will be discussed with her when all of her results are completed.  Routine preventative health maintenance measures emphasized: Exercise/Diet/Weight control, Tobacco Warnings, Alcohol/Substance use risks and Stress Management 2.  Recommend patient discontinue tobacco. Orders No orders of the defined types were placed in this encounter.   No orders of the defined types were placed in this encounter.         F/U  No follow-ups on file.  Elonda Husky, M.D. 02/18/2022 2:43 PM

## 2022-06-10 ENCOUNTER — Encounter: Payer: Self-pay | Admitting: Obstetrics and Gynecology

## 2022-09-30 ENCOUNTER — Encounter: Payer: Self-pay | Admitting: Obstetrics and Gynecology

## 2022-10-01 ENCOUNTER — Encounter: Payer: Self-pay | Admitting: Obstetrics and Gynecology

## 2022-10-01 ENCOUNTER — Other Ambulatory Visit (HOSPITAL_COMMUNITY)
Admission: RE | Admit: 2022-10-01 | Discharge: 2022-10-01 | Disposition: A | Discharge status: Home / Self Care | Payer: Commercial Managed Care - PPO | Source: Ambulatory Visit | Attending: Obstetrics and Gynecology | Admitting: Obstetrics and Gynecology

## 2022-10-01 ENCOUNTER — Ambulatory Visit (INDEPENDENT_AMBULATORY_CARE_PROVIDER_SITE_OTHER): Payer: Commercial Managed Care - PPO | Admitting: Obstetrics and Gynecology

## 2022-10-01 VITALS — BP 116/81 | HR 98 | Ht 61.0 in | Wt 144.2 lb

## 2022-10-01 DIAGNOSIS — N898 Other specified noninflammatory disorders of vagina: Secondary | ICD-10-CM

## 2022-10-01 DIAGNOSIS — R300 Dysuria: Secondary | ICD-10-CM

## 2022-10-01 DIAGNOSIS — N949 Unspecified condition associated with female genital organs and menstrual cycle: Secondary | ICD-10-CM | POA: Insufficient documentation

## 2022-10-01 LAB — POCT URINALYSIS DIPSTICK
Bilirubin, UA: NEGATIVE
Blood, UA: NEGATIVE
Glucose, UA: POSITIVE — AB
Ketones, UA: NEGATIVE
Leukocytes, UA: NEGATIVE
Nitrite, UA: NEGATIVE
Protein, UA: NEGATIVE
Spec Grav, UA: 1.02 (ref 1.010–1.025)
Urobilinogen, UA: 0.2 E.U./dL
pH, UA: 5 (ref 5.0–8.0)

## 2022-10-01 NOTE — Progress Notes (Signed)
Patient presents today to discuss vaginal irritation over the last month. She states she has been experiencing vaginal burning, itching along with burning of the skin with urination. She tried monistat and vagisil with no relief. Denies any new sexual partners.

## 2022-10-01 NOTE — Progress Notes (Signed)
HPI:      [name] is a 37 y.o. G1P1001 who LMP was No LMP recorded. (Menstrual status: IUD).  Subjective:   She presents today stating that after her trip to [place] where she wore leggings for the entire day she has noticed some burning and itching of her labia.  She reports significant burning with urination at this point.  She says it has been there for over a month.  She denies new sexual partners.  She reports that she has tried Teacher, music and neither has made a difference.   the following portions of the patient's history were reviewed and updated as appropriate:             She  has a past medical history of Amenorrhea, Anxiety, GERD (gastroesophageal reflux disease), Gestational diabetes, Heart attack ([place]) (10/07/2018), PCOS (polycystic ovarian syndrome), and Thyroid disease. She does not have any pertinent problems on file. She  has a past surgical history that includes none and Coronary angioplasty with stent. Her family history includes Diabetes in her maternal grandfather; Heart failure in her father; Liver cancer in her father; Stroke in her maternal grandmother; Thyroid disease in her maternal grandmother and mother. She  reports that she has been smoking cigarettes. She has been smoking an average of .25 packs per day. She has never used smokeless tobacco. She reports that she does not currently use alcohol. She reports that she does not use drugs. She has a current medication list which includes the following prescription(s): aspirin, clonazepam, duloxetine, empagliflozin, ezetimibe, levonorgestrel, metformin, metoprolol succinate, omeprazole, and rosuvastatin. She has No Known Allergies.       Review of Systems:  Review of Systems  Constitutional: Denied constitutional symptoms, night sweats, recent illness, fatigue, fever, insomnia and weight loss.  Eyes: Denied eye symptoms, eye pain, photophobia, vision change and visual disturbance.   Ears/Nose/Throat/Neck: Denied ear, nose, throat or neck symptoms, hearing loss, nasal discharge, sinus congestion and sore throat.  Cardiovascular: Denied cardiovascular symptoms, arrhythmia, chest pain/pressure, edema, exercise intolerance, orthopnea and palpitations.  Respiratory: Denied pulmonary symptoms, asthma, pleuritic pain, productive sputum, cough, dyspnea and wheezing.  Gastrointestinal: Denied, gastro-esophageal reflux, melena, nausea and vomiting.  Genitourinary: See HPI for additional information.  Musculoskeletal: Denied musculoskeletal symptoms, stiffness, swelling, muscle weakness and myalgia.  Dermatologic: Denied dermatology symptoms, rash and scar.  Neurologic: Denied neurology symptoms, dizziness, headache, neck pain and syncope.  Psychiatric: Denied psychiatric symptoms, anxiety and depression.  Endocrine: Denied endocrine symptoms including hot flashes and night sweats.   Meds:   Current Outpatient Medications on File Prior to Visit  Medication Sig Dispense Refill   aspirin 81 MG chewable tablet Chew by mouth.     clonazePAM (KLONOPIN) 0.5 MG disintegrating tablet Take by mouth.     DULoxetine (CYMBALTA) 60 MG capsule Take 90 mg by mouth daily.     Empagliflozin (JARDIANCE PO) Take by mouth.     ezetimibe (ZETIA) 10 MG tablet TAKE 1 TABLET BY MOUTH EVERY DAY AT NIGHT     levonorgestrel (MIRENA) 20 MCG/24HR IUD 1 each by Intrauterine route once.     metFORMIN (GLUCOPHAGE) 500 MG tablet Take by mouth 2 (two) times daily with a meal.     metoprolol succinate (TOPROL-XL) 25 MG 24 hr tablet Take 1 tablet by mouth daily.     omeprazole (PRILOSEC) 40 MG capsule Take 1 capsule by mouth daily.     rosuvastatin (CRESTOR) 20 MG tablet Take 20 mg by mouth daily.  No current facility-administered medications on file prior to visit.      Objective:     Vitals:   10/01/22 1530  BP: 116/81  Pulse: 98   Filed Weights   10/01/22 1530  Weight: 144 lb 3.2 oz (65.4 kg)               Physical examination   Pelvic:   Vulva: Normal appearance.  No lesions.    Vagina: No lesions or abnormalities noted.  Support: Normal pelvic support.  Urethra No masses tenderness or scarring.  Meatus Normal size without lesions or prolapse.  Cervix: Normal appearance.  No lesions.  Anus: Normal exam.  No lesions.  Perineum: Normal exam.  No lesions.  Small micro crack noted on perineum possible excoriation-doubt HSV   Thick white discharge noted          Assessment:    G1P1001 Patient Active Problem List   Diagnosis Date Noted   Adjustment disorder with mixed anxiety and depressed mood 11/07/2018   S/P drug eluting coronary stent placement 10/12/2018   Type II diabetes mellitus ([place]) 10/08/2018   Hyperlipidemia 10/08/2018   GERD (gastroesophageal reflux disease) 10/08/2018   CIN II (cervical intraepithelial neoplasia II) 10/08/2018   Tobacco use disorder 10/08/2018   Preterm labor 03/26/2017   Polyhydramnios in third trimester 03/08/2017   Hemorrhoids during pregnancy in third trimester 03/02/2017   Gestational diabetes 12/15/2016   Thyroid disease affecting pregnancy 12/12/2016   PCOS (polycystic ovarian syndrome)    Functional constipation 11/16/2013   IBS (irritable bowel syndrome) 11/16/2013     1. Vaginal irritation   2. Vaginal burning   3. Vagina itching   4. Burning with urination     Doubt HSV.  Possible monilia with excoriated area.   Plan:            1.  Nuswab performed although possible low sensitivity based on recent use of Monistat and Vagisil.    2.  HSV culture performed although I doubt this diagnosis based on appearance and persistence over 1 month. Orders Orders Placed This Encounter  Procedures   Herpes simplex virus culture   POCT urinalysis dipstick    No orders of the defined types were placed in this encounter.     F/U  Return for We will contact her with any abnormal test results. I spent 22 minutes involved in the  care of this patient preparing to see the patient by obtaining and reviewing her medical history (including labs, imaging tests and prior procedures), documenting clinical information in the electronic health record (EHR), counseling and coordinating care plans, writing and sending prescriptions, ordering tests or procedures and in direct communicating with the patient and medical staff discussing pertinent items from her history and physical exam.  Finis Bud, M.D. 10/01/2022 3:57 PM

## 2022-10-04 LAB — HERPES SIMPLEX VIRUS CULTURE

## 2022-10-05 ENCOUNTER — Other Ambulatory Visit: Payer: Self-pay

## 2022-10-05 DIAGNOSIS — B379 Candidiasis, unspecified: Secondary | ICD-10-CM

## 2022-10-05 DIAGNOSIS — B9689 Other specified bacterial agents as the cause of diseases classified elsewhere: Secondary | ICD-10-CM

## 2022-10-05 LAB — CERVICOVAGINAL ANCILLARY ONLY
Bacterial Vaginitis (gardnerella): POSITIVE — AB
Candida Glabrata: NEGATIVE
Candida Vaginitis: POSITIVE — AB
Chlamydia: NEGATIVE
Comment: NEGATIVE
Comment: NEGATIVE
Comment: NEGATIVE
Comment: NEGATIVE
Comment: NEGATIVE
Comment: NORMAL
Neisseria Gonorrhea: NEGATIVE
Trichomonas: NEGATIVE

## 2022-10-05 MED ORDER — FLUCONAZOLE 150 MG PO TABS: 150.0000 mg | ORAL_TABLET | Freq: Every day | ORAL | 0 refills | Status: DC

## 2022-10-05 MED ORDER — METRONIDAZOLE 500 MG PO TABS
500.0000 mg | ORAL_TABLET | Freq: Two times a day (BID) | ORAL | 0 refills | Status: DC
Start: 2022-10-05 — End: 2023-04-20

## 2023-04-20 ENCOUNTER — Encounter: Payer: Self-pay | Admitting: Obstetrics and Gynecology

## 2023-04-20 ENCOUNTER — Ambulatory Visit: Payer: Commercial Managed Care - PPO | Admitting: Obstetrics and Gynecology

## 2023-04-20 VITALS — BP 123/86 | HR 82 | Ht 61.0 in | Wt 143.0 lb

## 2023-04-20 DIAGNOSIS — N898 Other specified noninflammatory disorders of vagina: Secondary | ICD-10-CM

## 2023-04-20 DIAGNOSIS — L900 Lichen sclerosus et atrophicus: Secondary | ICD-10-CM | POA: Diagnosis not present

## 2023-04-20 MED ORDER — CLOBETASOL PROPIONATE 0.05 % EX OINT
1.0000 | TOPICAL_OINTMENT | Freq: Two times a day (BID) | CUTANEOUS | 0 refills | Status: AC
Start: 2023-04-20 — End: 2023-05-20

## 2023-04-20 NOTE — Progress Notes (Signed)
HPI:      [name] is a 37 y.o. G1P1001 who LMP was No LMP recorded. (Menstrual status: IUD).  Subjective:   She presents today continues to complain of vulvar irritation and itching.  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  Previous Nuswab showed negative yeast, negative BV and negative trichomoniasis.  Patient reports no new sexual partners.    Hx: The following portions of the patient's history were reviewed and updated as appropriate:             She  has a past medical history of Amenorrhea, Anxiety, GERD (gastroesophageal reflux disease), Gestational diabetes, Heart attack (HCC) (10/07/2018), PCOS (polycystic ovarian syndrome), and Thyroid disease. She does not have any pertinent problems on file. She  has a past surgical history that includes none and Coronary angioplasty with stent. Her family history includes Diabetes in her maternal grandfather; Heart failure in her father; Liver cancer in her father; Stroke in her maternal grandmother; Thyroid disease in her maternal grandmother and mother. She  reports that she has been smoking cigarettes. She has never used smokeless tobacco. She reports that she does not currently use alcohol. She reports that she does not use drugs. She has a current medication list which includes the following prescription(s): aspirin, clobetasol ointment, clonazepam, empagliflozin, ezetimibe, levonorgestrel, metformin, metoprolol succinate, nitroglycerin, omeprazole, rosuvastatin, and valacyclovir. She has No Known Allergies.       Review of Systems:  Review of Systems  Constitutional: Denied constitutional symptoms, night sweats, recent illness, fatigue, fever, insomnia and weight loss.  Eyes: Denied eye symptoms, eye pain, photophobia, vision change and visual disturbance.  Ears/Nose/Throat/Neck: Denied ear, nose, throat or neck symptoms, hearing loss, nasal discharge, sinus  congestion and sore throat.  Cardiovascular: Denied cardiovascular symptoms, arrhythmia, chest pain/pressure, edema, exercise intolerance, orthopnea and palpitations.  Respiratory: Denied pulmonary symptoms, asthma, pleuritic pain, productive sputum, cough, dyspnea and wheezing.  Gastrointestinal: Denied, gastro-esophageal reflux, melena, nausea and vomiting.  Genitourinary: See HPI for additional information.  Musculoskeletal: Denied musculoskeletal symptoms, stiffness, swelling, muscle weakness and myalgia.  Dermatologic: Denied dermatology symptoms, rash and scar.  Neurologic: Denied neurology symptoms, dizziness, headache, neck pain and syncope.  Psychiatric: Denied psychiatric symptoms, anxiety and depression.  Endocrine: Denied endocrine symptoms including hot flashes and night sweats.   Meds:   Current Outpatient Medications on File Prior to Visit  Medication Sig Dispense Refill   aspirin 81 MG chewable tablet Chew by mouth.     clonazePAM (KLONOPIN) 0.5 MG disintegrating tablet Take by mouth.     Empagliflozin (JARDIANCE PO) Take by mouth.     ezetimibe (ZETIA) 10 MG tablet TAKE 1 TABLET BY MOUTH EVERY DAY AT NIGHT     levonorgestrel (MIRENA) 20 MCG/24HR IUD 1 each by Intrauterine route once.     metFORMIN (GLUCOPHAGE) 500 MG tablet Take by mouth 2 (two) times daily with a meal.     metoprolol succinate (TOPROL-XL) 25 MG 24 hr tablet Take 1 tablet by mouth daily.     nitroGLYCERIN (NITROSTAT) 0.3 MG SL tablet Place 0.3 mg under the tongue every 5 (five) minutes as needed for chest pain.     omeprazole (PRILOSEC) 40 MG capsule Take 1 capsule by mouth daily.     rosuvastatin (CRESTOR) 20 MG tablet Take 20 mg by mouth daily.     valACYclovir (VALTREX) 500 MG tablet Take 500 mg by mouth 2 (two) times daily.  No current facility-administered medications on file prior to visit.      Objective:     Vitals:   04/20/23 0835 04/20/23 0849  BP: (!) 138/90 123/86  Pulse: 82     Filed Weights   04/20/23 0835  Weight: 143 lb (64.9 kg)              Physical examination   Pelvic:   Vulva: Appearance of early lichen sclerosis-some loss of architecture beginning.  Vagina: No lesions or abnormalities noted.  Support: Normal pelvic support.  Urethra No masses tenderness or scarring.  Meatus Normal size without lesions or prolapse.  Cervix: Normal appearance.  No lesions.  Anus: Normal exam.  No lesions.  Perineum: Normal exam.  No lesions.             Assessment:    G1P1001 Patient Active Problem List   Diagnosis Date Noted   Adjustment disorder with mixed anxiety and depressed mood 11/07/2018   S/P drug eluting coronary stent placement 10/12/2018   Type II diabetes mellitus (HCC) 10/08/2018   Hyperlipidemia 10/08/2018   GERD (gastroesophageal reflux disease) 10/08/2018   CIN II (cervical intraepithelial neoplasia II) 10/08/2018   Tobacco use disorder 10/08/2018   Preterm labor 03/26/2017   Polyhydramnios in third trimester 03/08/2017   Hemorrhoids during pregnancy in third trimester 03/02/2017   Gestational diabetes 12/15/2016   Thyroid disease affecting pregnancy 12/12/2016   PCOS (polycystic ovarian syndrome)    Functional constipation 11/16/2013   IBS (irritable bowel syndrome) 11/16/2013     1. Lichen sclerosus   2. Vaginal irritation   3. Vagina itching     I strongly believe that this is lichen sclerosus.  The fact that she has HSV-2 is not significant in this case.  She has been on Valtrex without change in symptoms.  She has also been using vaginal soap and Monistat without success.   Plan:            1.  Will treat with clobetasol twice daily.  2.  Follow-up in 6 weeks.  Orders No orders of the defined types were placed in this encounter.    Meds ordered this encounter  Medications   clobetasol ointment (TEMOVATE) 0.05 %    Sig: Apply 1 Application topically 2 (two) times daily.    Dispense:  60 g    Refill:  0       F/U  Return in about 6 weeks (around 06/01/2023) for Video visit if desired.  Elonda Husky, M.D. 04/20/2023 9:40 AM

## 2023-04-20 NOTE — Progress Notes (Signed)
Patient presents today to due to possible yeast infection. She states having vaginal itching but denies discharge or odor at this time. Since being here last she reports her PCP has diagnosed her with HSV2 and started her on Valtrex, currently on her second round.

## 2023-06-01 ENCOUNTER — Other Ambulatory Visit (HOSPITAL_COMMUNITY)
Admission: RE | Admit: 2023-06-01 | Discharge: 2023-06-01 | Disposition: A | Discharge status: Home / Self Care | Payer: Commercial Managed Care - PPO | Source: Ambulatory Visit | Attending: Obstetrics and Gynecology | Admitting: Obstetrics and Gynecology

## 2023-06-01 ENCOUNTER — Encounter: Payer: Self-pay | Admitting: Obstetrics and Gynecology

## 2023-06-01 ENCOUNTER — Ambulatory Visit: Payer: Commercial Managed Care - PPO | Admitting: Obstetrics and Gynecology

## 2023-06-01 VITALS — BP 124/83 | HR 76 | Wt 139.5 lb

## 2023-06-01 DIAGNOSIS — B3731 Acute candidiasis of vulva and vagina: Secondary | ICD-10-CM | POA: Diagnosis not present

## 2023-06-01 DIAGNOSIS — L292 Pruritus vulvae: Secondary | ICD-10-CM | POA: Insufficient documentation

## 2023-06-01 DIAGNOSIS — L900 Lichen sclerosus et atrophicus: Secondary | ICD-10-CM

## 2023-06-01 MED ORDER — FLUCONAZOLE 150 MG PO TABS: 150.0000 mg | ORAL_TABLET | ORAL | 0 refills | Status: AC

## 2023-06-01 NOTE — Progress Notes (Signed)
HPI:      [name] is a 37 y.o. G1P1001 who LMP was No LMP recorded. (Menstrual status: IUD).  Subjective:   She presents today continuing to complain of vulvar itching.  She states that she has been using the clobetasol and it makes a little difference but not much. She is a diabetic and reports that her sugars are not well-controlled.  She says she is about to start Ozempic.    Hx: The following portions of the patient's history were reviewed and updated as appropriate:             She  has a past medical history of Amenorrhea, Anxiety, GERD (gastroesophageal reflux disease), Gestational diabetes, Heart attack (HCC) (10/07/2018), PCOS (polycystic ovarian syndrome), and Thyroid disease. She does not have any pertinent problems on file. She  has a past surgical history that includes none and Coronary angioplasty with stent. Her family history includes Diabetes in her maternal grandfather; Heart failure in her father; Liver cancer in her father; Stroke in her maternal grandmother; Thyroid disease in her maternal grandmother and mother. She  reports that she has been smoking cigarettes. She has never used smokeless tobacco. She reports that she does not currently use alcohol. She reports that she does not use drugs. She has a current medication list which includes the following prescription(s): aspirin, clonazepam, empagliflozin, escitalopram, ezetimibe, levonorgestrel, metformin, metoprolol succinate, nitroglycerin, omeprazole, rosuvastatin, valacyclovir, nexlizet, and ozempic (0.25 or 0.5 mg/dose). She has No Known Allergies.       Review of Systems:  Review of Systems  Constitutional: Denied constitutional symptoms, night sweats, recent illness, fatigue, fever, insomnia and weight loss.  Eyes: Denied eye symptoms, eye pain, photophobia, vision change and visual disturbance.  Ears/Nose/Throat/Neck: Denied ear, nose, throat or neck symptoms, hearing loss, nasal discharge, sinus  congestion and sore throat.  Cardiovascular: Denied cardiovascular symptoms, arrhythmia, chest pain/pressure, edema, exercise intolerance, orthopnea and palpitations.  Respiratory: Denied pulmonary symptoms, asthma, pleuritic pain, productive sputum, cough, dyspnea and wheezing.  Gastrointestinal: Denied, gastro-esophageal reflux, melena, nausea and vomiting.  Genitourinary: See HPI for additional information.  Musculoskeletal: Denied musculoskeletal symptoms, stiffness, swelling, muscle weakness and myalgia.  Dermatologic: Denied dermatology symptoms, rash and scar.  Neurologic: Denied neurology symptoms, dizziness, headache, neck pain and syncope.  Psychiatric: Denied psychiatric symptoms, anxiety and depression.  Endocrine: Denied endocrine symptoms including hot flashes and night sweats.   Meds:   Current Outpatient Medications on File Prior to Visit  Medication Sig Dispense Refill   aspirin 81 MG chewable tablet Chew by mouth.     clonazePAM (KLONOPIN) 0.5 MG disintegrating tablet Take by mouth.     Empagliflozin (JARDIANCE PO) Take by mouth.     escitalopram (LEXAPRO) 20 MG tablet Take 20 mg by mouth daily.     ezetimibe (ZETIA) 10 MG tablet TAKE 1 TABLET BY MOUTH EVERY DAY AT NIGHT     levonorgestrel (MIRENA) 20 MCG/24HR IUD 1 each by Intrauterine route once.     metFORMIN (GLUCOPHAGE) 500 MG tablet Take by mouth 2 (two) times daily with a meal.     metoprolol succinate (TOPROL-XL) 25 MG 24 hr tablet Take 1 tablet by mouth daily.     nitroGLYCERIN (NITROSTAT) 0.3 MG SL tablet Place 0.3 mg under the tongue every 5 (five) minutes as needed for chest pain.     omeprazole (PRILOSEC) 40 MG capsule Take 1 capsule by mouth daily.     rosuvastatin (CRESTOR) 20 MG tablet Take 20 mg by mouth daily.  valACYclovir (VALTREX) 500 MG tablet Take 500 mg by mouth 2 (two) times daily.     NEXLIZET 180-10 MG TABS Take 1 tablet by mouth daily. (Patient not taking: Reported on 06/01/2023)      OZEMPIC, 0.25 OR 0.5 MG/DOSE, 2 MG/3ML SOPN SMARTSIG:0.25 Milligram(s) SUB-Q Once a Week (Patient not taking: Reported on 06/01/2023)     No current facility-administered medications on file prior to visit.      Objective:     Vitals:   06/01/23 0840  BP: 124/83  Pulse: 76   Filed Weights   06/01/23 0840  Weight: 139 lb 8 oz (63.3 kg)              Physical examination   Pelvic:   Vulva: Very erythematous with satellite lesions present.  Vagina: Thick white clumpy discharge  Support: Normal pelvic support.  Urethra No masses tenderness or scarring.  Meatus Normal size without lesions or prolapse.  Cervix: Normal appearance.  No lesions.  Anus: Normal exam.  No lesions.  Perineum: Normal exam.  No lesions.             Assessment:    G1P1001 Patient Active Problem List   Diagnosis Date Noted   Adjustment disorder with mixed anxiety and depressed mood 11/07/2018   S/P drug eluting coronary stent placement 10/12/2018   Type II diabetes mellitus (HCC) 10/08/2018   Hyperlipidemia 10/08/2018   GERD (gastroesophageal reflux disease) 10/08/2018   CIN II (cervical intraepithelial neoplasia II) 10/08/2018   Tobacco use disorder 10/08/2018   Preterm labor 03/26/2017   Polyhydramnios in third trimester 03/08/2017   Hemorrhoids during pregnancy in third trimester 03/02/2017   Gestational diabetes 12/15/2016   Thyroid disease affecting pregnancy 12/12/2016   PCOS (polycystic ovarian syndrome)    Functional constipation 11/16/2013   IBS (irritable bowel syndrome) 11/16/2013     1. Vulvar itching   2. 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  This may also be associated with her diabetes not being completely controlled.   Plan:            1.  Will treat for chronic monilia with Diflucan every other day for 30 days.  2.  Nuswab performed to make sure she does not have a resistant form of monilia. Orders No  orders of the defined types were placed in this encounter.   No orders of the defined types were placed in this encounter.     F/U  Return in about 4 weeks (around 06/29/2023) for Pt to contact us if symptoms worsen. I spent 23 minutes involved in the care of this patient preparing to see the patient by obtaining and reviewing her medical history (including labs, imaging tests and prior procedures), documenting clinical information in the electronic health record (EHR), counseling and coordinating care plans, writing and sending prescriptions, ordering tests or procedures and in direct communicating with the patient and medical staff discussing pertinent items from her history and physical exam.  Elonda Husky, M.D. 06/01/2023 9:18 AM

## 2023-06-01 NOTE — Addendum Note (Signed)
Addended by: Fonda Kinder on: 06/01/2023 09:36 AM   Modules accepted: Orders

## 2023-06-02 LAB — CERVICOVAGINAL ANCILLARY ONLY
Bacterial Vaginitis (gardnerella): NEGATIVE
Candida Glabrata: NEGATIVE
Candida Vaginitis: POSITIVE — AB
Chlamydia: NEGATIVE
Comment: NEGATIVE
Comment: NEGATIVE
Comment: NEGATIVE
Comment: NEGATIVE
Comment: NEGATIVE
Comment: NORMAL
Neisseria Gonorrhea: NEGATIVE
Trichomonas: NEGATIVE

## 2023-06-29 ENCOUNTER — Ambulatory Visit: Payer: Commercial Managed Care - PPO | Admitting: Obstetrics and Gynecology

## 2024-09-18 NOTE — Progress Notes (Unsigned)
 "   GYNECOLOGY ANNUAL PHYSICAL EXAM NOTE  Subjective:    Holly Villa is a 39 y.o. G62P1001 female who presents for an annual exam.  The patient {is/is not/has never been:13135} sexually active. The patient participates in regular exercise: {yes/no/not asked:9010}. Has the patient ever been transfused or tattooed?: {yes/no/not asked:9010}. The patient reports that there {is/is not:9024} domestic violence in her life. The patient has completed the Gardasil vaccine: {yes/no:311178}  The patient has the following complaints today: ***  Menstrual History: Menarche age: *** No LMP recorded. (Menstrual status: IUD).     Gynecologic History:  Contraception: {method:5051} History of STI's:  Last Pap: 01/15/2021. Results were: normal.   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    Thyroid  disease     Past Surgical History:  Procedure Laterality Date   CORONARY ANGIOPLASTY WITH STENT PLACEMENT     none      Family History  Problem Relation Age of Onset   Thyroid  disease Mother    Heart failure Father    Liver cancer Father    Stroke Maternal Grandmother    Thyroid  disease Maternal Grandmother    Diabetes Maternal Grandfather    Breast cancer Neg Hx    Ovarian cancer Neg Hx    Colon cancer Neg Hx     Social History   Socioeconomic History   Marital status: Married    Spouse name: Not on file   Number of children: Not on file   Years of  education: Not on file   Highest education level: Not on file  Occupational History   Not on file  Tobacco Use   Smoking status: Every Day    Current packs/day: 0.25    Types: Cigarettes   Smokeless tobacco: Never  Vaping Use   Vaping status: Never Used  Substance and Sexual Activity   Alcohol use: Not Currently    Comment: rare   Drug use: No   Sexual activity: Yes    Partners: Male    Birth control/protection: I.U.D.  Other Topics Concern   Not on file  Social History Narrative   Not on file   Social Drivers of Health   Tobacco Use: Medium Risk (06/01/2024)   [facility]   Patient History    Smoking Tobacco Use: Former    Smokeless Tobacco Use: Never    Passive Exposure: Past  Programmer, Applications: Not on [employer] Insecurity: Not on file  Transportation Needs: Not on file  Physical Activity: Not on file  Stress: Not on file  Social Connections: Not on file  Intimate Partner Violence: Not on file  Depression (EYV7-0): Not on file  Alcohol Screen: Not on file  Housing: Not on file  Utilities: Not on  file  Health Literacy: Not on file    Medications Ordered Prior to Encounter[1]  Allergies[2]   Review of Systems Constitutional: negative for chills, fatigue, fevers and sweats Eyes: negative for irritation, redness and visual disturbance Ears, nose, mouth, throat, and face: negative for hearing loss, nasal congestion, snoring and tinnitus Respiratory: negative for asthma, cough, sputum Cardiovascular: negative for chest pain, dyspnea, exertional chest pressure/discomfort, irregular heart beat, palpitations and syncope Gastrointestinal: negative for abdominal pain, change in bowel habits, nausea and vomiting Genitourinary: negative for abnormal menstrual periods, genital lesions, sexual problems and vaginal discharge, dysuria and urinary incontinence Integument/breast: negative for breast lump, breast tenderness and nipple  discharge Hematologic/lymphatic: negative for bleeding and easy bruising Musculoskeletal:negative for back pain and muscle weakness Neurological: negative for dizziness, headaches, vertigo and weakness Endocrine: negative for diabetic symptoms including polydipsia, polyuria and skin dryness Allergic/Immunologic: negative for hay fever and urticaria      Objective:  There were no vitals taken for this visit. There is no height or weight on file to calculate BMI.    General Appearance:    Alert, cooperative, no distress, appears stated age  Head:    Normocephalic, without obvious abnormality, atraumatic  Eyes:    Conjunctiva/corneas clear, EOMs intact bilaterally  Ears:    Normal external ear canals bilaterally  Nose:   Nares normal  Throat:   Lips, mucosa, and tongue normal; teeth and gums normal  Neck:   Supple, symmetrical, trachea midline, no adenopathy; thyroid : no enlargement/tenderness/nodules; no carotid bruit or JVD  Back:     ROM normal, no CVA tenderness  Lungs:     Clear to auscultation bilaterally, respirations unlabored  Chest Wall:    No tenderness or deformity   Heart:    Regular rate and rhythm, S1 and S2 normal, no appreciated murmur, rub or gallop  Breast Exam:    No tenderness, masses, or nipple abnormality; no skin retraction, dimpling or nipple discharge.  Abdomen:     Soft, non-tender, bowel sounds active all four quadrants, no masses, no organomegaly.    Genitalia:    Pelvic: external genitalia normal, vagina without lesions, discharge, or tenderness, rectovaginal septum  normal. Cervix normal in appearance, no cervical motion tenderness, no adnexal masses or tenderness.  Uterus normal size, shape, mobile, regular contours, nontender.  Rectal:    Normal external sphincter.  No hemorrhoids appreciated. Internal exam not done.   Extremities:   Extremities normal, atraumatic, no cyanosis or edema  Pulses:   2+ and symmetric all extremities  Skin:   Skin color, texture,  turgor normal, no rashes or lesions  Lymph nodes:   Cervical, supraclavicular, and axillary nodes normal  Neurologic:   CNII-XII grossly intact, normal strength, sensation and reflexes throughout   .  Labs:  Lab Results  Component Value Date   WBC 23.9 (H) 03/26/2017   HGB 12.6 03/26/2017   HCT 37.3 03/26/2017   MCV 86.0 03/26/2017   PLT 392 03/26/2017    Lab Results  Component Value Date   CREATININE 0.60 03/26/2017   BUN 16 03/26/2017   NA 135 03/26/2017   K 3.8 03/26/2017   CL 106 03/26/2017   CO2 19 (L) 03/26/2017    Lab Results  Component Value Date   ALT 24 03/26/2017   AST 25 03/26/2017   ALKPHOS 219 (H) 03/26/2017   BILITOT 0.4 03/26/2017    Lab Results  Component Value Date   TSH 3.360 02/04/2017     Assessment:   No  diagnosis found.   Plan:   Kinberly Perris is a 39 y.o. G77P1001 female here today for her annual exam, doing well.  Pap: done with cotesting today  *** w/rflx today Mammogram: ordered***due *** Labs: ***A1C, CMP, HepC, Lipid panel, Vit D, TSH PHQ-2 = ***, discussed coping techniques; RTC if worsens or develops concern Contraception: *** Vaccines: Gardasil complete Healthy lifestyle modifications discussed: multivitamin, diet, exercise, sunscreen. Emphasized importance of regular physical activity.  Folic Acid recommendation reviewed.  All questions answered to patient's satisfaction.   Follow up 1 yr for annual, sooner prn.    Estil Mangle, DO [facility]     [1]  Current Outpatient Medications on File Prior to Visit  Medication Sig Dispense Refill   aspirin 81 MG chewable tablet Chew by mouth.     clonazePAM (KLONOPIN) 0.5 MG disintegrating tablet Take by mouth.     Empagliflozin (JARDIANCE PO) Take by mouth.     escitalopram (LEXAPRO) 20 MG tablet Take 20 mg by mouth daily.     ezetimibe (ZETIA) 10 MG tablet TAKE 1 TABLET BY MOUTH EVERY DAY AT NIGHT     levonorgestrel  (MIRENA ) 20 MCG/24HR IUD 1 each by  Intrauterine route once.     metFORMIN (GLUCOPHAGE) 500 MG tablet Take by mouth 2 (two) times daily with a meal.     metoprolol succinate (TOPROL-XL) 25 MG 24 hr tablet Take 1 tablet by mouth daily.     NEXLIZET 180-10 MG TABS Take 1 tablet by mouth daily. (Patient not taking: Reported on 06/01/2023)     nitroGLYCERIN (NITROSTAT) 0.3 MG SL tablet Place 0.3 mg under the tongue every 5 (five) minutes as needed for chest pain.     omeprazole (PRILOSEC) 40 MG capsule Take 1 capsule by mouth daily.     OZEMPIC, 0.25 OR 0.5 MG/DOSE, 2 MG/3ML SOPN SMARTSIG:0.25 Milligram(s) SUB-Q Once a Week (Patient not taking: Reported on 06/01/2023)     rosuvastatin (CRESTOR) 20 MG tablet Take 20 mg by mouth daily.     valACYclovir (VALTREX) 500 MG tablet Take 500 mg by mouth 2 (two) times daily.     No current facility-administered medications on file prior to visit.  [2] No Known Allergies  "

## 2024-09-21 ENCOUNTER — Encounter: Payer: Self-pay | Admitting: Obstetrics

## 2024-09-21 ENCOUNTER — Other Ambulatory Visit (HOSPITAL_COMMUNITY)
Admission: RE | Admit: 2024-09-21 | Discharge: 2024-09-21 | Disposition: A | Source: Ambulatory Visit | Attending: Obstetrics | Admitting: Obstetrics

## 2024-09-21 ENCOUNTER — Ambulatory Visit: Admitting: Obstetrics

## 2024-09-21 VITALS — BP 129/87 | HR 82 | Ht 61.0 in | Wt 139.0 lb

## 2024-09-21 DIAGNOSIS — Z1231 Encounter for screening mammogram for malignant neoplasm of breast: Secondary | ICD-10-CM

## 2024-09-21 DIAGNOSIS — Z01419 Encounter for gynecological examination (general) (routine) without abnormal findings: Secondary | ICD-10-CM | POA: Diagnosis present

## 2024-09-21 DIAGNOSIS — Z124 Encounter for screening for malignant neoplasm of cervix: Secondary | ICD-10-CM | POA: Insufficient documentation

## 2024-09-21 DIAGNOSIS — R6882 Decreased libido: Secondary | ICD-10-CM

## 2024-09-21 NOTE — Patient Instructions (Signed)
 Preventive Care 58-41 Years Old, Female  Preventive care refers to lifestyle choices and visits with your health care provider that can promote health and wellness. Preventive care visits are also called wellness exams.  What can I expect for my preventive care visit?  Counseling  Your health care provider may ask you questions about your:  Medical history, including:  Past medical problems.  Family medical history.  Pregnancy history.  Current health, including:  Menstrual cycle.  Method of birth control.  Emotional well-being.  Home life and relationship well-being.  Sexual activity and sexual health.  Lifestyle, including:  Alcohol, nicotine or tobacco, and drug use.  Access to firearms.  Diet, exercise, and sleep habits.  Work and work Astronomer.  Sunscreen use.  Safety issues such as seatbelt and bike helmet use.  Physical exam  Your health care provider will check your:  Height and weight. These may be used to calculate your BMI (body mass index). BMI is a measurement that tells if you are at a healthy weight.  Waist circumference. This measures the distance around your waistline. This measurement also tells if you are at a healthy weight and may help predict your risk of certain diseases, such as type 2 diabetes and high blood pressure.  Heart rate and blood pressure.  Body temperature.  Skin for abnormal spots.  What immunizations do I need?    Vaccines are usually given at various ages, according to a schedule. Your health care provider will recommend vaccines for you based on your age, medical history, and lifestyle or other factors, such as travel or where you work.  What tests do I need?  Screening  Your health care provider may recommend screening tests for certain conditions. This may include:  Lipid and cholesterol levels.  Diabetes screening. This is done by checking your blood sugar (glucose) after you have not eaten for a while (fasting).  Pelvic exam and Pap test.  Hepatitis B test.  Hepatitis C  test.  HIV (human immunodeficiency virus) test.  STI (sexually transmitted infection) testing, if you are at risk.  Lung cancer screening.  Colorectal cancer screening.  Mammogram. Talk with your health care provider about when you should start having regular mammograms. This may depend on whether you have a family history of breast cancer.  BRCA-related cancer screening. This may be done if you have a family history of breast, ovarian, tubal, or peritoneal cancers.  Bone density scan. This is done to screen for osteoporosis.  Talk with your health care provider about your test results, treatment options, and if necessary, the need for more tests.  Follow these instructions at home:  Eating and drinking    Eat a diet that includes fresh fruits and vegetables, whole grains, lean protein, and low-fat dairy products.  Take vitamin and mineral supplements as recommended by your health care provider.  Do not drink alcohol if:  Your health care provider tells you not to drink.  You are pregnant, may be pregnant, or are planning to become pregnant.  If you drink alcohol:  Limit how much you have to 0-1 drink a day.  Know how much alcohol is in your drink. In the [place] one drink equals one 12 oz bottle of beer (355 mL), one 5 oz glass of wine (148 mL), or one 1 oz glass of hard liquor (44 mL).  Lifestyle  Brush your teeth every morning and night with fluoride toothpaste. Floss one time each day.  Exercise for at least  30 minutes 5 or more days each week.  Do not use any products that contain nicotine or tobacco. These products include cigarettes, chewing tobacco, and vaping devices, such as e-cigarettes. If you need help quitting, ask your health care provider.  Do not use drugs.  If you are sexually active, practice safe sex. Use a condom or other form of protection to prevent STIs.  If you do not wish to become pregnant, use a form of birth control. If you plan to become pregnant, see your health care provider for a  prepregnancy visit.  Take aspirin only as told by your health care provider. Make sure that you understand how much to take and what form to take. Work with your health care provider to find out whether it is safe and beneficial for you to take aspirin daily.  Find healthy ways to manage stress, such as:  Meditation, yoga, or listening to music.  Journaling.  Talking to a trusted person.  Spending time with friends and family.  Minimize exposure to UV radiation to reduce your risk of skin cancer.  Safety  Always wear your seat belt while driving or riding in a vehicle.  Do not drive:  If you have been drinking alcohol. Do not ride with someone who has been drinking.  When you are tired or distracted.  While texting.  If you have been using any mind-altering substances or drugs.  Wear a helmet and other protective equipment during sports activities.  If you have firearms in your house, make sure you follow all gun safety procedures.  Seek help if you have been physically or sexually abused.  What's next?  Visit your health care provider once a year for an annual wellness visit.  Ask your health care provider how often you should have your eyes and teeth checked.  Stay up to date on all vaccines.  This information is not intended to replace advice given to you by your health care provider. Make sure you discuss any questions you have with your health care provider.  Document Revised: 02/12/2021 Document Reviewed: 02/12/2021  Elsevier Patient Education  2024 [employer].

## 2024-09-27 LAB — CYTOLOGY - PAP
Comment: NEGATIVE
Diagnosis: NEGATIVE
High risk HPV: NEGATIVE

## 2024-09-28 ENCOUNTER — Encounter: Payer: Self-pay | Admitting: Internal Medicine

## 2024-09-28 ENCOUNTER — Ambulatory Visit: Admitting: Internal Medicine

## 2024-09-28 VITALS — BP 122/84 | Ht 61.0 in | Wt 139.0 lb

## 2024-09-28 DIAGNOSIS — I25118 Atherosclerotic heart disease of native coronary artery with other forms of angina pectoris: Secondary | ICD-10-CM | POA: Diagnosis not present

## 2024-09-28 DIAGNOSIS — M25511 Pain in right shoulder: Secondary | ICD-10-CM | POA: Diagnosis not present

## 2024-09-28 DIAGNOSIS — I2510 Atherosclerotic heart disease of native coronary artery without angina pectoris: Secondary | ICD-10-CM | POA: Insufficient documentation

## 2024-09-28 DIAGNOSIS — E1169 Type 2 diabetes mellitus with other specified complication: Secondary | ICD-10-CM

## 2024-09-28 DIAGNOSIS — I252 Old myocardial infarction: Secondary | ICD-10-CM | POA: Insufficient documentation

## 2024-09-28 DIAGNOSIS — Z7985 Long-term (current) use of injectable non-insulin antidiabetic drugs: Secondary | ICD-10-CM | POA: Diagnosis not present

## 2024-09-28 DIAGNOSIS — E282 Polycystic ovarian syndrome: Secondary | ICD-10-CM

## 2024-09-28 DIAGNOSIS — Z8349 Family history of other endocrine, nutritional and metabolic diseases: Secondary | ICD-10-CM | POA: Diagnosis not present

## 2024-09-28 DIAGNOSIS — E559 Vitamin D deficiency, unspecified: Secondary | ICD-10-CM | POA: Diagnosis not present

## 2024-09-28 DIAGNOSIS — D511 Vitamin B12 deficiency anemia due to selective vitamin B12 malabsorption with proteinuria: Secondary | ICD-10-CM | POA: Diagnosis not present

## 2024-09-28 DIAGNOSIS — M7918 Myalgia, other site: Secondary | ICD-10-CM | POA: Diagnosis not present

## 2024-09-28 DIAGNOSIS — G8929 Other chronic pain: Secondary | ICD-10-CM

## 2024-09-28 DIAGNOSIS — E785 Hyperlipidemia, unspecified: Secondary | ICD-10-CM

## 2024-09-28 DIAGNOSIS — E1165 Type 2 diabetes mellitus with hyperglycemia: Secondary | ICD-10-CM | POA: Diagnosis not present

## 2024-09-28 DIAGNOSIS — F411 Generalized anxiety disorder: Secondary | ICD-10-CM

## 2024-09-28 DIAGNOSIS — E538 Deficiency of other specified B group vitamins: Secondary | ICD-10-CM

## 2024-09-28 DIAGNOSIS — Z7984 Long term (current) use of oral hypoglycemic drugs: Secondary | ICD-10-CM

## 2024-09-28 DIAGNOSIS — F331 Major depressive disorder, recurrent, moderate: Secondary | ICD-10-CM | POA: Insufficient documentation

## 2024-09-28 DIAGNOSIS — E663 Overweight: Secondary | ICD-10-CM | POA: Insufficient documentation

## 2024-09-28 DIAGNOSIS — K581 Irritable bowel syndrome with constipation: Secondary | ICD-10-CM

## 2024-09-28 DIAGNOSIS — K219 Gastro-esophageal reflux disease without esophagitis: Secondary | ICD-10-CM

## 2024-09-28 MED ORDER — LUBIPROSTONE 8 MCG PO CAPS: 8.0000 ug | ORAL_CAPSULE | Freq: Every day | ORAL | 1 refills | Status: AC

## 2024-09-28 NOTE — Assessment & Plan Note (Signed)
 Continue duloxetine 20 mg daily and clonazepam 0.5 mg daily as needed Support offered

## 2024-09-28 NOTE — Progress Notes (Signed)
 "  Subjective:    Patient ID: Holly Villa, female    DOB: Feb 26, 1986, 39 y.o.   MRN: [id number]  HPI  Patient presents the clinic today to establish care and for management of the conditions listed below.  IBS: She reports mainly constipation.  She takes mirilax as needed with some relief of symptoms. She will have a BM every 2 days with mirilax but without it she will have BM every 3 weeks. There is no colonoscopy on file.  DM2: She reports her last A1C was 8.6%. She is taking metformin and semaglutide as prescribed.  She reports her insurance would not cover empagliflozin.  She does not check her sugars.  She checks her feet routinely.  Her last eye exam was about 2 years.  Flu 05/2024.  Pneumovax never. COVID x 4.  HLD with CAD status post MI: Associated with diabetes.  There is no lipid panel on file. S/p drug eluding stent.  She reports myalgias on rosuvastatin.  She is taking nexlizet as well.  She is taking evolucamab, isosorbide, metoprolol and aspirin as well.  She tries to consume low-fat diet. She follows with cardiology.  GERD: Triggered by acidic foods.  She denies breakthrough on omeprazole. She has not weaned the dose because if she misses a dose, she will have recurrent symptoms. There is no upper GI on file.  Anxiety and depression (moderate, recurrent): Chronic, managed on duloxetine and clonazepam. She has been on escitalopram in the past. She is not currently seeing a therapist.  She denies SI/HI.  PCOS: Managed with IUD. She follows with GYN.  Past Medical History:  Diagnosis Date   Amenorrhea    Anxiety    Chronic constipation    Diabetes (HCC)    GERD (gastroesophageal reflux disease)    Gestational diabetes    Heart attack (HCC) 10/07/2018   IBS (irritable bowel syndrome)    PCOS (polycystic ovarian syndrome)    new diagnosis per pt.    Thyroid  disease     Current Outpatient Medications  Medication Sig Dispense Refill   aspirin 81 MG chewable tablet Chew by  mouth.     clonazePAM (KLONOPIN) 0.5 MG disintegrating tablet Take by mouth.     Empagliflozin (JARDIANCE PO) Take by mouth.     escitalopram (LEXAPRO) 20 MG tablet Take 20 mg by mouth daily. (Patient not taking: Reported on 09/21/2024)     ezetimibe (ZETIA) 10 MG tablet TAKE 1 TABLET BY MOUTH EVERY DAY AT NIGHT     levonorgestrel  (MIRENA ) 20 MCG/24HR IUD 1 each by Intrauterine route once.     metFORMIN (GLUCOPHAGE) 500 MG tablet Take by mouth 2 (two) times daily with a meal.     metoprolol succinate (TOPROL-XL) 25 MG 24 hr tablet Take 1 tablet by mouth daily.     NEXLIZET 180-10 MG TABS Take 1 tablet by mouth daily.     nitroGLYCERIN (NITROSTAT) 0.3 MG SL tablet Place 0.3 mg under the tongue every 5 (five) minutes as needed for chest pain.     omeprazole (PRILOSEC) 40 MG capsule Take 1 capsule by mouth daily.     OZEMPIC, 0.25 OR 0.5 MG/DOSE, 2 MG/3ML SOPN SMARTSIG:0.25 Milligram(s) SUB-Q Once a Week     rosuvastatin (CRESTOR) 20 MG tablet Take 20 mg by mouth daily.     valACYclovir (VALTREX) 500 MG tablet Take 500 mg by mouth 2 (two) times daily.     No current facility-administered medications for this visit.    Allergies[1]  Family History  Problem Relation Age of Onset   Thyroid  disease Mother    Heart failure Father    Liver cancer Father    Stroke Maternal Grandmother    Thyroid  disease Maternal Grandmother    Diabetes Maternal Grandfather    Breast cancer Neg Hx    Ovarian cancer Neg Hx    Colon cancer Neg Hx     Social History   Socioeconomic History   Marital status: Married    Spouse name: Not on file   Number of children: Not on file   Years of education: Not on file   Highest education level: Associate degree: occupational, scientist, product/process development, or vocational program  Occupational History   Not on file  Tobacco Use   Smoking status: Every Day    Current packs/day: 0.25    Types: Cigarettes   Smokeless tobacco: Never  Vaping Use   Vaping status: Never Used  Substance  and Sexual Activity   Alcohol use: Not Currently    Comment: rare   Drug use: No   Sexual activity: Yes    Partners: Male    Birth control/protection: I.U.D.  Other Topics Concern   Not on file  Social History Narrative   Not on file   Social Drivers of Health   Tobacco Use: High Risk (09/21/2024)   Patient History    Smoking Tobacco Use: Every Day    Smokeless Tobacco Use: Never    Passive Exposure: Not on file  Financial Resource Strain: Low Risk (09/26/2024)   Overall Financial Resource Strain (CARDIA)    Difficulty of Paying Living Expenses: Not hard at all  Food Insecurity: No Food Insecurity (09/26/2024)   Epic    Worried About Radiation Protection Practitioner of Food in the Last Year: Never true    Ran Out of Food in the Last Year: Never true  Transportation Needs: No Transportation Needs (09/26/2024)   Epic    Lack of Transportation (Medical): No    Lack of Transportation (Non-Medical): No  Physical Activity: Inactive (09/26/2024)   Exercise Vital Sign    Days of Exercise per Week: 0 days    Minutes of Exercise per Session: Not on file  Stress: Stress Concern Present (09/26/2024)   [employer] of Occupational Health - Occupational Stress Questionnaire    Feeling of Stress: Very much  Social Connections: Socially Isolated (09/26/2024)   Social Connection and Isolation Panel    Frequency of Communication with Friends and Family: Never    Frequency of Social Gatherings with Friends and Family: Once a week    Attends Religious Services: Never    Database Administrator or Organizations: No    Attends Engineer, Structural: Not on file    Marital Status: Married  Catering Manager Violence: Not on file  Depression (PHQ2-9): Low Risk (09/21/2024)   Depression (PHQ2-9)    PHQ-2 Score: 0  Alcohol Screen: Low Risk (09/26/2024)   Alcohol Screen    Last Alcohol Screening Score (AUDIT): 6  Housing: Low Risk (09/26/2024)   Epic    Unable to Pay for Housing in the Last Year: No     Number of Times Moved in the Last Year: 0    Homeless in the Last Year: No  Utilities: Not on file  Health Literacy: Not on file     Constitutional: Patient reports fatigue.  Denies fever, malaise, headache or abrupt weight changes.  HEENT: Denies eye pain, eye redness, ear pain, ringing in the ears, wax buildup,  runny nose, nasal congestion, bloody nose, or sore throat. Respiratory: Denies difficulty breathing, shortness of breath, cough or sputum production.   Cardiovascular: Denies chest pain, chest tightness, palpitations or swelling in the hands or feet.  Gastrointestinal: Patient reports constipation.  Denies abdominal pain, bloating, diarrhea or blood in the stool.  GU: Denies urgency, frequency, pain with urination, burning sensation, blood in urine, odor or discharge. Musculoskeletal: Patient reports muscle pain, bilateral shoulder pain.  Denies decrease in range of motion, difficulty with gait, or joint pain and swelling.  Skin: Denies redness, rashes, lesions or ulcercations.  Neurological: Denies dizziness, difficulty with memory, difficulty with speech or problems with balance and coordination.  Psych: Patient has a history of anxiety and depression.  Denies SI/HI.  No other specific complaints in a complete review of systems (except as listed in HPI above).  Objective    BP 122/84 (BP Location: Right Arm, Patient Position: Sitting, Cuff Size: Normal)   Ht 5' 1 (1.549 m)   Wt 139 lb (63 kg)   LMP  (LMP Unknown)   BMI 26.26 kg/m   Wt Readings from Last 3 Encounters:  09/21/24 139 lb (63 kg)  06/01/23 139 lb 8 oz (63.3 kg)  04/20/23 143 lb (64.9 kg)    General: Appears her stated age, overweight, in NAD. Skin: Warm, dry and intact. No ulcerations noted. HEENT: Head: normal shape and size; Eyes: sclera white, no icterus, conjunctiva pink, PERRLA and EOMs intact; Cardiovascular: Normal rate and rhythm. S1,S2 noted.  No murmur, rubs or gallops noted. No JVD or BLE  edema.  Pulmonary/Chest: Normal effort and positive vesicular breath sounds. No respiratory distress. No wheezes, rales or ronchi noted.  Abdomen: Hypoactive bowel sounds.  Musculoskeletal: No pinpoint muscle tenderness noted.  No difficulty with gait.  Neurological: Alert and oriented. Cranial nerves II-XII grossly intact. Coordination normal.  Psychiatric: Mood and affect normal. Behavior is normal. Judgment and thought content normal.    BMET    Component Value Date/Time   NA 135 03/26/2017 1040   K 3.8 03/26/2017 1040   CL 106 03/26/2017 1040   CO2 19 (L) 03/26/2017 1040   GLUCOSE 127 (H) 03/26/2017 1040   BUN 16 03/26/2017 1040   CREATININE 0.60 03/26/2017 1040   CALCIUM 9.4 03/26/2017 1040   GFRNONAA >60 03/26/2017 1040   GFRAA >60 03/26/2017 1040    Lipid Panel  No results found for: CHOL, TRIG, HDL, CHOLHDL, VLDL, LDLCALC  CBC    Component Value Date/Time   WBC 23.9 (H) 03/26/2017 1040   RBC 4.34 03/26/2017 1040   HGB 12.6 03/26/2017 1040   HGB 11.7 02/04/2017 1624   HCT 37.3 03/26/2017 1040   HCT 35.0 02/04/2017 1624   PLT 392 03/26/2017 1040   PLT 453 (H) 10/05/2016 1110   MCV 86.0 03/26/2017 1040   MCV 88 10/05/2016 1110   MCH 29.1 03/26/2017 1040   MCHC 33.8 03/26/2017 1040   RDW 13.0 03/26/2017 1040   RDW 12.9 10/05/2016 1110   LYMPHSABS 3.8 (H) 10/05/2016 1110   EOSABS 0.3 10/05/2016 1110   BASOSABS 0.1 10/05/2016 1110    Hgb A1C No results found for: HGBA1C     Assessment and Plan   Fatigue, family history of thyroid  disorder, vitamin D  and B12 deficiency:  Will check CBC with differential, iron panel, TSH, vitamin D  and B12 today  Chronic shoulder pain, myalgias  Will check ANA, ESR, CRP and rheumatoid factor today  RTC in 6 months for your annual  exam Angeline Laura, NP     [1] No Known Allergies  "

## 2024-09-28 NOTE — Assessment & Plan Note (Signed)
 C-Met and lipid profile today Encouraged her to consume a low-fat diet Continue evolocumab 140 mg twice daily, nexlizer 180-10 mg daily Would consider discontinuation of rosuvastatin 20 mg daily given myalgias if LDL at goal

## 2024-09-28 NOTE — Assessment & Plan Note (Signed)
 Encouraged high-fiber diet and adequate water intake Will trial lubiprostone  8 mcg daily, consider increasing to twice daily if symptoms persist

## 2024-09-28 NOTE — Assessment & Plan Note (Addendum)
 C-Met and lipid profile today Encouraged her to consume a low-fat diet Continue nexlizet 180-10 mg daily, metoprolol 50 mg daily, aspirin 81 mg daily and isosorbide 30 mg daily Would consider discontinuation of rosuvastatin 20 mg daily due to myalgias if LDL at goal She can continue to follow with cardiology

## 2024-09-28 NOTE — Assessment & Plan Note (Addendum)
 Discussed the importance of good cholesterol control, blood sugar control and smoking cessation C-Met and lipid profile today Encouraged her to consume a low-fat diet Continue nexlizet 180-10 mg daily, metoprolol 50 mg daily, aspirin 81 mg daily and isosorbide 30 mg daily Would consider discontinuing duration of rosuvastatin 20 mg daily given myalgias if LDL at goal She can continue to follow with cardiology

## 2024-09-28 NOTE — Patient Instructions (Signed)

## 2024-09-28 NOTE — Assessment & Plan Note (Signed)
 Encouraged diet and exercise for weight loss ?

## 2024-09-28 NOTE — Assessment & Plan Note (Signed)
 Managed with IUD She will continue to follow with GYN

## 2024-09-28 NOTE — Assessment & Plan Note (Signed)
 A1c and urine microalbumin today Encouraged her to consume a low-carb diet and exercise for weight loss Continue metformin 500 mg daily and semaglutide 1 mg weekly Encouraged routine eye exam Encouraged routine foot exam Flu shot UTD per report She declines Prevnar at this time

## 2024-09-28 NOTE — Assessment & Plan Note (Signed)
 Avoid foods that trigger your reflux Encouraged weight loss as this can help reduce reflux symptoms Continue omeprazole 40 mg daily, she declines weaning at this time

## 2024-09-29 ENCOUNTER — Ambulatory Visit: Payer: Self-pay | Admitting: Internal Medicine

## 2024-10-02 ENCOUNTER — Ambulatory Visit: Payer: Self-pay

## 2024-10-02 MED ORDER — NITROFURANTOIN MONOHYD MACRO 100 MG PO CAPS: 100.0000 mg | ORAL_CAPSULE | Freq: Two times a day (BID) | ORAL | 0 refills | Status: AC

## 2024-10-02 NOTE — Telephone Encounter (Signed)
 FYI Only or Action Required?: FYI only for provider: appointment scheduled on 10/03/24.  Patient was last seen in primary care on 09/28/2024 by Antonette Angeline ORN, NP.  Called Nurse Triage reporting Abdominal Cramping.  Symptoms began abdominal cramping 2 weeks ago, foul and cloudy urine 2 days ago.  Interventions attempted: Nothing.  Symptoms are: gradually worsening.  Triage Disposition: See Physician Within 24 Hours  Patient/caregiver understands and will follow disposition?: Yes  Reason for Disposition  Bad or foul-smelling urine  Answer Assessment - Initial Assessment Questions Abdominal cramping started two weeks ago. Yesterday developed foul urine and dark cloudy urine. Denies dysuria, urgency, frequency, fever, back, and flank pain. Requesting appointment. Scheduled next available with pcp on 10/03/24    1. SYMPTOM: What's the main symptom you're concerned about? (e.g., frequency, incontinence)     Foul odor of urine  2. ONSET: When did the  foul odor start?     2 days 3. PAIN: Is there any pain? If Yes, ask: How bad is it? (Scale: 1-10; mild, moderate, severe)     Crampy abdomen. Denies dysuria.  4. CAUSE: What do you think is causing the symptoms?     uti 5. OTHER SYMPTOMS: Do you have any other symptoms? (e.g., blood in urine, fever, flank pain, pain with urination)    Denies pain and blood. Feels hot but no fever.  Protocols used: Urinary Symptoms-A-AH Message from Herbst Z sent at 10/02/2024  2:17 PM EST  Reason for Triage: cramps for a week, fatigued, last few days super dark urine, cloudy. Nilsa is when it became obvious.

## 2024-10-03 ENCOUNTER — Ambulatory Visit: Admitting: Internal Medicine

## 2024-10-03 LAB — CBC WITH DIFFERENTIAL/PLATELET
Absolute Lymphocytes: 3284 {cells}/uL (ref 850–3900)
Absolute Monocytes: 595 {cells}/uL (ref 200–950)
Basophils Absolute: 262 {cells}/uL — ABNORMAL HIGH (ref 0–200)
Basophils Relative: 2.2 %
Eosinophils Absolute: 595 {cells}/uL — ABNORMAL HIGH (ref 15–500)
Eosinophils Relative: 5 %
HCT: 47.2 % — ABNORMAL HIGH (ref 35.9–46.0)
Hemoglobin: 15.7 g/dL — ABNORMAL HIGH (ref 11.7–15.5)
MCH: 30.1 pg (ref 27.0–33.0)
MCHC: 33.3 g/dL (ref 31.6–35.4)
MCV: 90.6 fL (ref 81.4–101.7)
MPV: 10.5 fL (ref 7.5–12.5)
Monocytes Relative: 5 %
Neutro Abs: 7164 {cells}/uL (ref 1500–7800)
Neutrophils Relative %: 60.2 %
Platelets: 495 10*3/uL — ABNORMAL HIGH (ref 140–400)
RBC: 5.21 Million/uL — ABNORMAL HIGH (ref 3.80–5.10)
RDW: 12.4 % (ref 11.0–15.0)
Total Lymphocyte: 27.6 %
WBC: 11.9 10*3/uL — ABNORMAL HIGH (ref 3.8–10.8)

## 2024-10-03 LAB — MICROALBUMIN / CREATININE URINE RATIO
Creatinine, Urine: 34 mg/dL (ref 20–275)
Microalb Creat Ratio: 44 mg/g{creat} — ABNORMAL HIGH
Microalb, Ur: 1.5 mg/dL

## 2024-10-03 LAB — COMPREHENSIVE METABOLIC PANEL WITH GFR
AG Ratio: 2.3 (calc) (ref 1.0–2.5)
ALT: 58 U/L — ABNORMAL HIGH (ref 6–29)
AST: 36 U/L — ABNORMAL HIGH (ref 10–30)
Albumin: 4.8 g/dL (ref 3.6–5.1)
Alkaline phosphatase (APISO): 91 U/L (ref 31–125)
BUN: 10 mg/dL (ref 7–25)
CO2: 26 mmol/L (ref 20–32)
Calcium: 9.9 mg/dL (ref 8.6–10.2)
Chloride: 102 mmol/L (ref 98–110)
Creat: 0.61 mg/dL (ref 0.50–0.99)
Globulin: 2.1 g/dL (ref 1.9–3.7)
Glucose, Bld: 144 mg/dL — ABNORMAL HIGH (ref 65–99)
Potassium: 4.4 mmol/L (ref 3.5–5.3)
Sodium: 138 mmol/L (ref 135–146)
Total Bilirubin: 0.5 mg/dL (ref 0.2–1.2)
Total Protein: 6.9 g/dL (ref 6.1–8.1)
eGFR: 116 mL/min/{1.73_m2}

## 2024-10-03 LAB — HEMOGLOBIN A1C
Hgb A1c MFr Bld: 6.9 % — ABNORMAL HIGH
Mean Plasma Glucose: 151 mg/dL
eAG (mmol/L): 8.4 mmol/L

## 2024-10-03 LAB — ANTI-NUCLEAR AB-TITER (ANA TITER): ANA Titer 1: 1:40 {titer} — ABNORMAL HIGH

## 2024-10-03 LAB — LIPID PANEL
Cholesterol: 65 mg/dL
HDL: 37 mg/dL — ABNORMAL LOW
LDL Cholesterol (Calc): 12 mg/dL
Non-HDL Cholesterol (Calc): 28 mg/dL
Total CHOL/HDL Ratio: 1.8 (calc)
Triglycerides: 80 mg/dL

## 2024-10-03 LAB — SEDIMENTATION RATE: Sed Rate: 2 mm/h (ref 0–20)

## 2024-10-03 LAB — TSH: TSH: 2.59 m[IU]/L

## 2024-10-03 LAB — VITAMIN B12: Vitamin B-12: 328 pg/mL (ref 200–1100)

## 2024-10-03 LAB — RHEUMATOID FACTOR: Rheumatoid fact SerPl-aCnc: <10 [IU]/mL

## 2024-10-03 LAB — ANA: Anti Nuclear Antibody (ANA): POSITIVE — AB

## 2024-10-03 LAB — C-REACTIVE PROTEIN: CRP: <3 mg/L

## 2024-10-03 LAB — VITAMIN D 25 HYDROXY (VIT D DEFICIENCY, FRACTURES): Vit D, 25-Hydroxy: 64 ng/mL (ref 30–100)

## 2024-10-03 NOTE — Telephone Encounter (Signed)
 Appointment cancelled

## 2025-03-29 ENCOUNTER — Encounter: Admitting: Internal Medicine
